# Patient Record
Sex: Female | Born: 1990 | Race: Black or African American | Hispanic: No | Marital: Single | Smoking: Never smoker
Health system: Southern US, Community
[De-identification: ages and names within clinical notes are randomized; demographics above are authoritative.]

## PROBLEM LIST (undated history)

## (undated) ENCOUNTER — Inpatient Hospital Stay (HOSPITAL_COMMUNITY): Payer: Self-pay

## (undated) DIAGNOSIS — E559 Vitamin D deficiency, unspecified: Secondary | ICD-10-CM

## (undated) DIAGNOSIS — E7800 Pure hypercholesterolemia, unspecified: Secondary | ICD-10-CM

## (undated) HISTORY — PX: WISDOM TOOTH EXTRACTION: SHX21

## (undated) HISTORY — PX: KNEE SURGERY: SHX244

---
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
## 2013-04-30 ENCOUNTER — Inpatient Hospital Stay (HOSPITAL_COMMUNITY)
Admission: AD | Admit: 2013-04-30 | Discharge: 2013-04-30 | Disposition: A | Discharge status: Home / Self Care | Payer: Medicaid Other | Source: Ambulatory Visit | Attending: Obstetrics & Gynecology | Admitting: Obstetrics & Gynecology

## 2013-04-30 ENCOUNTER — Inpatient Hospital Stay (HOSPITAL_COMMUNITY): Payer: Medicaid Other

## 2013-04-30 ENCOUNTER — Encounter (HOSPITAL_COMMUNITY): Payer: Self-pay | Admitting: *Deleted

## 2013-04-30 DIAGNOSIS — O210 Mild hyperemesis gravidarum: Secondary | ICD-10-CM | POA: Insufficient documentation

## 2013-04-30 DIAGNOSIS — O219 Vomiting of pregnancy, unspecified: Secondary | ICD-10-CM

## 2013-04-30 DIAGNOSIS — O208 Other hemorrhage in early pregnancy: Secondary | ICD-10-CM | POA: Insufficient documentation

## 2013-04-30 DIAGNOSIS — R109 Unspecified abdominal pain: Secondary | ICD-10-CM | POA: Insufficient documentation

## 2013-04-30 DIAGNOSIS — O26899 Other specified pregnancy related conditions, unspecified trimester: Secondary | ICD-10-CM

## 2013-04-30 HISTORY — DX: Pure hypercholesterolemia, unspecified: E78.00

## 2013-04-30 HISTORY — DX: Vitamin D deficiency, unspecified: E55.9

## 2013-04-30 LAB — URINALYSIS, ROUTINE W REFLEX MICROSCOPIC
Bilirubin Urine: NEGATIVE
Glucose, UA: NEGATIVE mg/dL
Hgb urine dipstick: NEGATIVE
KETONES UR: 40 mg/dL — AB
NITRITE: NEGATIVE
PH: 7.5 (ref 5.0–8.0)
PROTEIN: 30 mg/dL — AB
Specific Gravity, Urine: 1.015 (ref 1.005–1.030)
Urobilinogen, UA: 1 mg/dL (ref 0.0–1.0)

## 2013-04-30 LAB — WET PREP, GENITAL
TRICH WET PREP: NONE SEEN
YEAST WET PREP: NONE SEEN

## 2013-04-30 LAB — POCT PREGNANCY, URINE: PREG TEST UR: POSITIVE — AB

## 2013-04-30 LAB — HCG, QUANTITATIVE, PREGNANCY: hCG, Beta Chain, Quant, S: 59199 m[IU]/mL — ABNORMAL HIGH (ref <5)

## 2013-04-30 LAB — URINE MICROSCOPIC-ADD ON

## 2013-04-30 MED ORDER — ONDANSETRON 4 MG PO TBDP: 4.0000 mg | ORAL_TABLET | Freq: Three times a day (TID) | ORAL | Status: DC | PRN

## 2013-04-30 MED ORDER — DEXTROSE 5 % IN LACTATED RINGERS IV BOLUS
1000.0000 mL | Freq: Once | INTRAVENOUS | Status: AC
  Administered 2013-04-30: 1000 mL via INTRAVENOUS

## 2013-04-30 MED ORDER — PROMETHAZINE HCL 25 MG PO TABS: 25.0000 mg | ORAL_TABLET | Freq: Four times a day (QID) | ORAL | Status: DC | PRN

## 2013-04-30 MED ORDER — ONDANSETRON 8 MG/NS 50 ML IVPB
8.0000 mg | Freq: Once | INTRAVENOUS | Status: AC
  Administered 2013-04-30: 8 mg via INTRAVENOUS
  Filled 2013-04-30: qty 8

## 2013-04-30 NOTE — Discharge Instructions (Signed)
Abdominal Pain During Pregnancy °Abdominal pain is common in pregnancy. Most of the time, it does not cause harm. There are many causes of abdominal pain. Some causes are more serious than others. Some of the causes of abdominal pain in pregnancy are easily diagnosed. Occasionally, the diagnosis takes time to understand. Other times, the cause is not determined. Abdominal pain can be a sign that something is very wrong with the pregnancy, or the pain may have nothing to do with the pregnancy at all. For this reason, always tell your health care provider if you have any abdominal discomfort. °HOME CARE INSTRUCTIONS  °Monitor your abdominal pain for any changes. The following actions may help to alleviate any discomfort you are experiencing: °· Do not have sexual intercourse or put anything in your vagina until your symptoms go away completely. °· Get plenty of rest until your pain improves. °· Drink clear fluids if you feel nauseous. Avoid solid food as long as you are uncomfortable or nauseous. °· Only take over-the-counter or prescription medicine as directed by your health care provider. °· Keep all follow-up appointments with your health care provider. °SEEK IMMEDIATE MEDICAL CARE IF: °· You are bleeding, leaking fluid, or passing tissue from the vagina. °· You have increasing pain or cramping. °· You have persistent vomiting. °· You have painful or bloody urination. °· You have a fever. °· You notice a decrease in your baby's movements. °· You have extreme weakness or feel faint. °· You have shortness of breath, with or without abdominal pain. °· You develop a severe headache with abdominal pain. °· You have abnormal vaginal discharge with abdominal pain. °· You have persistent diarrhea. °· You have abdominal pain that continues even after rest, or gets worse. °MAKE SURE YOU:  °· Understand these instructions. °· Will watch your condition. °· Will get help right away if you are not doing well or get  worse. °Document Released: 03/15/2005 Document Revised: 01/03/2013 Document Reviewed: 10/12/2012 °ExitCare® Patient Information ©2014 [employer]. ° °

## 2013-04-30 NOTE — MAU Provider Note (Signed)
Attestation of Attending Supervision of Advanced Practitioner (CNM/NP): Evaluation and management procedures were performed by the Advanced Practitioner under my supervision and collaboration.  I have reviewed the Advanced Practitioner's note and chart, and I agree with the management and plan.  [name], [name] 5:02 PM     

## 2013-04-30 NOTE — MAU Provider Note (Signed)
History     CSN: [id number]  Arrival date and time: 04/30/13 1228   First Provider Initiated Contact with Patient 04/30/13 1441      Chief Complaint  Patient presents with  . Hyperemesis Gravidarum   HPI  Ms. Tiffany Ball is a 23 y.o. female G1P0 at 5174w6d who presents with vomiting in pregnancy. The vomiting started 2 weeks ago; she vomits everyday. She has not taken anything for the vomiting and has not seen a Dr. for this pregnancy. The patient is accompanied by her mother who states the patient plans to start care with [facility]. The patient also complains of lower-mid abdominal pain that occurs every time she vomits. She is vomiting three times per day and states she is unable to keep any food or liquids.  Pt currently rates her abdominal pain 8/10  OB History   Grav Para Term Preterm Abortions TAB SAB Ect Mult Living   1         0      Past Medical History  Diagnosis Date  . Hypercholesteremia   . Vitamin D deficiency     Past Surgical History  Procedure Laterality Date  . Knee cyst  2008  . Knee surgery      right  . Wisdom tooth extraction      History reviewed. No pertinent family history.  History  Substance Use Topics  . Smoking status: Never Smoker   . Smokeless tobacco: Not on file  . Alcohol Use: No    Allergies: No Known Allergies  Prescriptions prior to admission  Medication Sig Dispense Refill  . Prenatal Vit-Fe Fumarate-FA (PRENATAL MULTIVITAMIN) TABS tablet Take 1 tablet by mouth daily at 12 noon.       Results for orders placed during the hospital encounter of 04/30/13 (from the past 48 hour(s))  URINALYSIS, ROUTINE W REFLEX MICROSCOPIC     Status: Abnormal   Collection Time    04/30/13  1:18 PM      Result Value Range   Color, Urine YELLOW  YELLOW   APPearance CLEAR  CLEAR   Specific Gravity, Urine 1.015  1.005 - 1.030   pH 7.5  5.0 - 8.0   Glucose, UA NEGATIVE  NEGATIVE mg/dL   Hgb urine dipstick NEGATIVE  NEGATIVE   Bilirubin Urine  NEGATIVE  NEGATIVE   Ketones, ur 40 (*) NEGATIVE mg/dL   Protein, ur 30 (*) NEGATIVE mg/dL   Urobilinogen, UA 1.0  0.0 - 1.0 mg/dL   Nitrite NEGATIVE  NEGATIVE   Leukocytes, UA TRACE (*) NEGATIVE  URINE MICROSCOPIC-ADD ON     Status: Abnormal   Collection Time    04/30/13  1:18 PM      Result Value Range   Squamous Epithelial / LPF FEW (*) RARE   WBC, UA 0-2  <3 WBC/hpf   Bacteria, UA FEW (*) RARE   Urine-Other MUCOUS PRESENT    POCT PREGNANCY, URINE     Status: Abnormal   Collection Time    04/30/13  1:22 PM      Result Value Range   Preg Test, Ur POSITIVE (*) NEGATIVE   Comment:            THE SENSITIVITY OF THIS     METHODOLOGY IS >24 mIU/mL  HCG, QUANTITATIVE, PREGNANCY     Status: Abnormal   Collection Time    04/30/13  3:06 PM      Result Value Range   hCG, Beta Chain, Quant, S 59199 (*) <  5 mIU/mL   Comment:              GEST. AGE      CONC.  (mIU/mL)       <=1 WEEK        5 - 50         2 WEEKS       50 - 500         3 WEEKS       100 - 10,000         4 WEEKS     1,000 - 30,000         5 WEEKS     3,500 - 115,000       6-8 WEEKS     12,000 - 270,000        12 WEEKS     15,000 - 220,000                FEMALE AND NON-PREGNANT FEMALE:         LESS THAN 5 mIU/mL  WET PREP, GENITAL     Status: Abnormal   Collection Time    04/30/13  4:04 PM      Result Value Range   Yeast Wet Prep HPF POC NONE SEEN  NONE SEEN   Trich, Wet Prep NONE SEEN  NONE SEEN   Clue Cells Wet Prep HPF POC MODERATE (*) NONE SEEN   WBC, Wet Prep HPF POC MANY (*) NONE SEEN   Comment: MANY BACTERIA SEEN    US Ob Comp Less 14 Wks  04/30/2013   CLINICAL DATA:  22 year old female with abdominal pain. Early pregnancy. Nausea vomiting. Quantitative beta HCG pending. Estimated gestational age by LMP 9 weeks and 6 days. Initial encounter.  EXAM: OBSTETRIC <14 WK ULTRASOUND  TECHNIQUE: Transabdominal ultrasound was performed for evaluation of the gestation as well as the maternal uterus and adnexal regions.   COMPARISON:  None.  FINDINGS: Intrauterine gestational sac: Single  Yolk sac:  Visible  Embryo:  Visible  Cardiac Activity: Detected  Heart Rate: 140 bpm  CRL:   9.5  mm   7 w 0 d                  [place] EDC: 12/17/2013  Maternal uterus/adnexae: Small volume subchorionic hemorrhage (image 11). No pelvic free fluid. Normal left ovary which appears to contain corpus luteum (image 32). Right ovary not identified.  IMPRESSION: Single living IUP demonstrated. Small volume subchorionic hemorrhage, otherwise no acute maternal findings visualized.   Electronically Signed   By: Augusto Gamble M.D.   On: 04/30/2013 16:01    Review of Systems  Constitutional: Negative for fever and chills.  Gastrointestinal: Positive for nausea, vomiting and abdominal pain. Negative for heartburn, diarrhea and constipation.  Genitourinary: Negative for dysuria, urgency, frequency and hematuria.       No vaginal discharge. No vaginal bleeding. No dysuria.    Physical Exam   Blood pressure 100/58, pulse 72, temperature 98.7 F (37.1 C), temperature source Oral, resp. rate 18, height 5\' 2"  (1.575 m), weight 44.623 kg (98 lb 6 oz), last menstrual period 02/20/2013.  Physical Exam  Constitutional: She is oriented to person, place, and time. She appears well-developed and well-nourished.  Non-toxic appearance. She does not have a sickly appearance. She appears ill. No distress.  HENT:  Head: Normocephalic.  Eyes: Pupils are equal, round, and reactive to light.  Neck: Neck supple.  GI: There is tenderness in the periumbilical area  and suprapubic area. There is rigidity. There is no rebound and no guarding.  Genitourinary:  Bimanual exam: Cervix closed Uterus non tender, normal size; gravid  Adnexa non tender, no masses bilaterally GC/Chlam, wet prep done Chaperone present for exam.   Neurological: She is alert and oriented to person, place, and time.  Skin: Skin is warm. She is not diaphoretic.    MAU Course   Procedures None  MDM Wet prep  GC/Chlamydia- no speculum exam done; blind swabs only. Pt has never had an [place].  D5 LR times one liter bolus  Zofran 8 mg IVP Pt tolerate po fluids and crackers in MAU prior to discharge home  Abdominal pain is gone; pt rates her pain 0/10  Assessment and Plan    A: IUP on US done 04/30/2013 Subchorionic hemorrhage  Nausea and vomiting in pregnancy Abdominal pain in pregnancy    P:  Discharge home Wet prep pending; I will call the patient if abnormal RX: Zofran         Phenergan  Start prenatal care ASAP Return to MAU as needed, if symptoms worsen    Iona Hansen Dewey Neukam, NP  04/30/2013, 2:42 PM

## 2013-04-30 NOTE — MAU Note (Signed)
Pt states here for n/v, spitting a lot. Vomited x3 today. Having lower abdominal pain. Denies abnormal vaginal discharge or bleeding.

## 2013-05-01 ENCOUNTER — Telehealth: Payer: Self-pay | Admitting: *Deleted

## 2013-05-01 LAB — GC/CHLAMYDIA PROBE AMP
CT PROBE, AMP APTIMA: POSITIVE — AB
GC PROBE AMP APTIMA: NEGATIVE

## 2013-05-01 MED ORDER — AZITHROMYCIN 250 MG PO TABS: ORAL_TABLET | ORAL | Status: DC

## 2013-05-01 NOTE — Telephone Encounter (Addendum)
Message copied by Jill SideAY, DIANE L on Tue May 01, 2013 10:07 AM ------      Message from: Pennie BanterSMITH, MARNI W      Created: Tue May 01, 2013  8:49 AM       Telephone call to patient regarding positive chlamydia culture, patient notified.  Patient has not been treated and will need Rx called in per protocol to [facility] on [address].  Instructed patient to notify her partner for treatment and to abstain from sex until seven days past date both are treated.  Report faxed to health department.   ------  Rx for Azithromycin sent to pharmacy per protocol.

## 2013-10-04 ENCOUNTER — Emergency Department (HOSPITAL_COMMUNITY)
Admission: EM | Admit: 2013-10-04 | Discharge: 2013-10-04 | Disposition: A | Discharge status: Home / Self Care | Payer: Medicaid Other | Attending: Emergency Medicine | Admitting: Emergency Medicine

## 2013-10-04 ENCOUNTER — Encounter (HOSPITAL_COMMUNITY): Payer: Self-pay | Admitting: Emergency Medicine

## 2013-10-04 ENCOUNTER — Emergency Department (HOSPITAL_COMMUNITY): Payer: Medicaid Other

## 2013-10-04 DIAGNOSIS — Y9389 Activity, other specified: Secondary | ICD-10-CM | POA: Insufficient documentation

## 2013-10-04 DIAGNOSIS — IMO0002 Reserved for concepts with insufficient information to code with codable children: Secondary | ICD-10-CM | POA: Insufficient documentation

## 2013-10-04 DIAGNOSIS — Z79899 Other long term (current) drug therapy: Secondary | ICD-10-CM | POA: Diagnosis not present

## 2013-10-04 DIAGNOSIS — Z862 Personal history of diseases of the blood and blood-forming organs and certain disorders involving the immune mechanism: Secondary | ICD-10-CM | POA: Insufficient documentation

## 2013-10-04 DIAGNOSIS — Z8639 Personal history of other endocrine, nutritional and metabolic disease: Secondary | ICD-10-CM | POA: Insufficient documentation

## 2013-10-04 DIAGNOSIS — Y92410 Unspecified street and highway as the place of occurrence of the external cause: Secondary | ICD-10-CM | POA: Diagnosis not present

## 2013-10-04 DIAGNOSIS — S0990XA Unspecified injury of head, initial encounter: Secondary | ICD-10-CM | POA: Insufficient documentation

## 2013-10-04 MED ORDER — TRAMADOL HCL 50 MG PO TABS
50.0000 mg | ORAL_TABLET | Freq: Once | ORAL | Status: AC
  Administered 2013-10-04: 50 mg via ORAL
  Filled 2013-10-04: qty 1

## 2013-10-04 MED ORDER — TRAMADOL HCL 50 MG PO TABS: 50.0000 mg | ORAL_TABLET | Freq: Four times a day (QID) | ORAL | Status: DC | PRN

## 2013-10-04 NOTE — ED Notes (Signed)
Patient was discharged with all personal items. 
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
## 2013-10-04 NOTE — ED Notes (Signed)
Pt now reports chest pain 5/10 and sharp. Pt reports having intermittent chest pain for "a few months".

## 2013-10-04 NOTE — Discharge Instructions (Signed)
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
## 2013-10-09 NOTE — ED Provider Notes (Signed)
Medical screening examination/treatment/procedure(s) were performed by non-physician practitioner and as supervising physician I was immediately available for consultation/collaboration.   EKG Interpretation   Date/Time:  Thursday October 04 2013 20:20:57 EDT Ventricular Rate:  99 PR Interval:  128 QRS Duration: 74 QT Interval:  336 QTC Calculation: 431 R Axis:   78 Text Interpretation:  Normal sinus rhythm Normal ECG No old tracing to  compare Confirmed by Hodan Wurtz  MD-I, Halynn Reitano ([id number]) on 10/04/2013 8:33:22 PM      Devoria AlbeIva Emmry Hinsch, MD, Franz DellFACEP   Omair Dettmer L Eylin Pontarelli, MD 10/09/13 [phone]

## 2014-01-28 ENCOUNTER — Encounter (HOSPITAL_COMMUNITY): Payer: Self-pay | Admitting: Emergency Medicine

## 2014-10-18 ENCOUNTER — Inpatient Hospital Stay (HOSPITAL_COMMUNITY)
Admission: AD | Admit: 2014-10-18 | Discharge: 2014-10-19 | Discharge status: Against Medical Advice | Payer: Medicaid Other | Source: Ambulatory Visit | Attending: Obstetrics and Gynecology | Admitting: Obstetrics and Gynecology

## 2014-10-18 DIAGNOSIS — Z5320 Procedure and treatment not carried out because of patient's decision for unspecified reasons: Secondary | ICD-10-CM | POA: Insufficient documentation

## 2014-10-18 LAB — URINE MICROSCOPIC-ADD ON

## 2014-10-18 LAB — URINALYSIS, ROUTINE W REFLEX MICROSCOPIC
Glucose, UA: NEGATIVE mg/dL
Hgb urine dipstick: NEGATIVE
Ketones, ur: 15 mg/dL — AB
Nitrite: NEGATIVE
Protein, ur: NEGATIVE mg/dL
Specific Gravity, Urine: 1.025 (ref 1.005–1.030)
Urobilinogen, UA: 4 mg/dL — ABNORMAL HIGH (ref 0.0–1.0)
pH: 6 (ref 5.0–8.0)

## 2014-10-18 LAB — POCT PREGNANCY, URINE: Preg Test, Ur: NEGATIVE

## 2014-10-18 NOTE — MAU Note (Signed)
My cousin came to be checked for STD so I told her i would come to to make her feel better. Denies any d/c. Started period on 7/20 and having some cramping from that. Wants to be checked for STDs

## 2014-10-19 NOTE — MAU Note (Signed)
Pt's family member being discharged so patient decided to leave AMA. Paper signed.

## 2015-03-11 ENCOUNTER — Encounter (HOSPITAL_COMMUNITY): Payer: Self-pay | Admitting: Emergency Medicine

## 2015-03-11 ENCOUNTER — Emergency Department (INDEPENDENT_AMBULATORY_CARE_PROVIDER_SITE_OTHER)
Admission: EM | Admit: 2015-03-11 | Discharge: 2015-03-11 | Disposition: A | Discharge status: Home / Self Care | Payer: Self-pay | Source: Home / Self Care | Attending: Emergency Medicine | Admitting: Emergency Medicine

## 2015-03-11 DIAGNOSIS — A059 Bacterial foodborne intoxication, unspecified: Secondary | ICD-10-CM

## 2015-03-11 LAB — POCT URINALYSIS DIP (DEVICE)
Bilirubin Urine: NEGATIVE
Glucose, UA: NEGATIVE mg/dL
HGB URINE DIPSTICK: NEGATIVE
Ketones, ur: NEGATIVE mg/dL
Leukocytes, UA: NEGATIVE
NITRITE: NEGATIVE
PH: 5.5 (ref 5.0–8.0)
PROTEIN: 30 mg/dL — AB
UROBILINOGEN UA: 0.2 mg/dL (ref 0.0–1.0)

## 2015-03-11 LAB — POCT PREGNANCY, URINE: Preg Test, Ur: NEGATIVE

## 2015-03-11 MED ORDER — ONDANSETRON HCL 4 MG PO TABS: 4.0000 mg | ORAL_TABLET | Freq: Three times a day (TID) | ORAL | Status: DC | PRN

## 2015-03-11 MED ORDER — ONDANSETRON HCL 4 MG/2ML IJ SOLN
INTRAMUSCULAR | Status: AC
  Filled 2015-03-11: qty 2

## 2015-03-11 MED ORDER — ONDANSETRON HCL 4 MG/2ML IJ SOLN
4.0000 mg | Freq: Once | INTRAMUSCULAR | Status: AC
  Administered 2015-03-11: 4 mg via INTRAVENOUS

## 2015-03-11 MED ORDER — ONDANSETRON HCL 4 MG/2ML IJ SOLN: 4.0000 mg | Freq: Once | INTRAMUSCULAR | Status: DC

## 2015-03-11 MED ORDER — SODIUM CHLORIDE 0.9 % IV BOLUS (SEPSIS)
1000.0000 mL | Freq: Once | INTRAVENOUS | Status: AC
  Administered 2015-03-11: 1000 mL via INTRAVENOUS

## 2015-03-11 NOTE — ED Notes (Signed)
Patient was able to tolerate oral fluid challenge with no nausea and was able to ambulate without assistance.

## 2015-03-11 NOTE — Discharge Instructions (Signed)
You likely have food poisoning. Take Zofran every 8 hours as needed for nausea or vomiting. Make sure you are drinking lots of fluids. Your symptoms should improve over the next 24-48 hours. Follow-up as needed.

## 2015-03-11 NOTE — ED Notes (Addendum)
[facility] with a complaint of N/V/D and abdominal cramping that started around 12 noon today. The patient had active emesis at the time of assessment.

## 2015-03-11 NOTE — ED Provider Notes (Signed)
CSN: [id number]     Arrival date & time 03/11/15  1818 History   First MD Initiated Contact with Patient 03/11/15 1848     Chief Complaint  Patient presents with  . Emesis  . Abdominal Cramping   (Consider location/radiation/quality/duration/timing/severity/associated sxs/prior Treatment) HPI  She is a 24 year old woman here for evaluation of vomiting. She states she started having vomiting, diffuse abdominal pain, and diarrhea around noon today. The vomit is yellow. She denies any blood in the vomit or diarrhea. She has been unable to keep anything down, including fluids. She does report feeling dizzy, particularly with standing. She had cookout for dinner last night. No known sick contacts.  No fevers or chills.  History reviewed. No pertinent past medical history. No past surgical history on file. History reviewed. No pertinent family history. Social History  Substance Use Topics  . Smoking status: None  . Smokeless tobacco: None  . Alcohol Use: None   OB History    No data available     Review of Systems As in history of present illness Allergies  Review of patient's allergies indicates no known allergies.  Home Medications   Prior to Admission medications   Medication Sig Start Date End Date Taking? Authorizing Provider  ondansetron (ZOFRAN) 4 MG tablet Take 1 tablet (4 mg total) by mouth every 8 (eight) hours as needed for nausea or vomiting. 03/11/15   Charm RingsErin J Honig, MD   Meds Ordered and Administered this Visit   Medications  sodium chloride 0.9 % bolus 1,000 mL (1,000 mLs Intravenous Given 03/11/15 1952)  ondansetron (ZOFRAN) injection 4 mg (4 mg Intravenous Given 03/11/15 1955)    BP 128/76 mmHg  Pulse 106  Temp(Src) 97.9 F (36.6 C) (Oral)  Resp 18  SpO2 100%  LMP 02/21/2015 (Exact Date) Orthostatic VS for the past 24 hrs:  BP- Lying Pulse- Lying BP- Sitting Pulse- Sitting BP- Standing at 0 minutes Pulse- Standing at 0 minutes  03/11/15 1857 134/83 mmHg  107 119/87 mmHg 124 133/77 mmHg 135    Physical Exam  Constitutional: She is oriented to person, place, and time. She appears well-developed and well-nourished. No distress.  HENT:  Dry mucous membranes  Neck: Neck supple.  Cardiovascular: Normal rate, regular rhythm and normal heart sounds.   No murmur heard. Normal rate when lying flat on exam table  Pulmonary/Chest: Effort normal and breath sounds normal. No respiratory distress. She has no wheezes. She has no rales.  Abdominal: Soft. Bowel sounds are normal. She exhibits no distension and no mass. There is tenderness (diffuse). There is guarding (Voluntary). There is no rebound.  Neurological: She is alert and oriented to person, place, and time.    ED Course  Procedures (including critical care time)  Labs Review Labs Reviewed  POCT URINALYSIS DIP (DEVICE) - Abnormal; Notable for the following:    Protein, ur 30 (*)    All other components within normal limits  POCT PREGNANCY, URINE    Imaging Review No results found.    MDM   1. Food poisoning    She is tolerating fluids well after a 1 L normal saline bolus and 4 mg of IV Zofran. She is stable for discharge home. Prescription given for Zofran to use as needed. Expect improvement over the next 1-2 days.    Charm RingsErin J Honig, MD 03/11/15 2017

## 2015-03-11 NOTE — ED Notes (Signed)
Patient's vitals reassessed and oral fluid challenge initiated.

## 2015-07-25 ENCOUNTER — Encounter (HOSPITAL_COMMUNITY): Payer: Self-pay | Admitting: Emergency Medicine

## 2015-07-25 ENCOUNTER — Emergency Department (HOSPITAL_COMMUNITY)
Admission: EM | Admit: 2015-07-25 | Discharge: 2015-07-25 | Disposition: A | Discharge status: Home / Self Care | Payer: Self-pay | Attending: Emergency Medicine | Admitting: Emergency Medicine

## 2015-07-25 DIAGNOSIS — K047 Periapical abscess without sinus: Secondary | ICD-10-CM

## 2015-07-25 DIAGNOSIS — K029 Dental caries, unspecified: Secondary | ICD-10-CM

## 2015-07-25 DIAGNOSIS — K002 Abnormalities of size and form of teeth: Secondary | ICD-10-CM | POA: Insufficient documentation

## 2015-07-25 DIAGNOSIS — N760 Acute vaginitis: Secondary | ICD-10-CM

## 2015-07-25 MED ORDER — PENICILLIN V POTASSIUM 500 MG PO TABS: 1000.0000 mg | ORAL_TABLET | Freq: Two times a day (BID) | ORAL | Status: DC

## 2015-07-25 MED ORDER — FLUCONAZOLE 150 MG PO TABS: 150.0000 mg | ORAL_TABLET | Freq: Once | ORAL | Status: DC

## 2015-07-25 NOTE — ED Provider Notes (Signed)
CSN: [id number]     Arrival date & time 07/25/15  2133 History  By signing my name below, I, Tanda RockersMargaux Venter, attest that this documentation has been prepared under the direction and in the presence of [id number]Lyndel Dancel, [employer]PA-C. Electronically Signed: Tanda RockersMargaux Venter, ED Scribe. 07/25/2015. 9:56 PM.   Chief Complaint  Patient presents with  . Dental Pain   Patient is a 25 y.o. female presenting with tooth pain. The history is provided by the patient. No language interpreter was used.  Dental Pain Location:  Lower Lower teeth location:  32/RL 3rd molar Quality:  Throbbing Severity:  Severe Onset quality:  Gradual Duration:  2 weeks Timing:  Constant Progression:  Waxing and waning Chronicity:  New Context: dental fracture   Relieved by:  Nothing Worsened by:  Touching, cold food/drink and pressure (chewing) Ineffective treatments:  Acetaminophen and topical anesthetic gel Associated symptoms: facial swelling (gums) and gum swelling   Associated symptoms: no difficulty swallowing, no drooling, no fever, no oral bleeding and no trismus   Risk factors: lack of dental care   Risk factors: no diabetes, no immunosuppression and no smoking      HPI Comments: Timmothy EulerQuandrika L Puglia is a 25 y.o. female who presents to the Emergency Department complaining of gradual onset, constant, 10/10, throbbing, right lower dental pain radiating to right ear x 2 weeks, worsening in the past week. Pt reports that her tooth is cracked, causing the pain. The pain is exacerbated with cold air, palpation, and chewing. Pt also complains of mild gum swelling. She has been applying Orajel and taking Tylenol PM without relief. Pt is able to open and close her mouth and is tolerating her own secretions. Denies drainage from the gums, bleeding from the gums, drooling, difficulty swallowing, trismus, fever, chills, ear drainage, rhinorrhea, chest pain, shortness of breath, abdominal pain, nausea, vomiting, diarrhea, constipation,  dysuria, hematuria, weakness, numbness, tingling, joint pain, joint swelling, or any other associated symptoms. Pt is non smoker. No hx DM/HIV or other immunosuppressant conditions/medications. No dentist.  At the end of the encounter, pt also reports having a thick white/creamy itchy vaginal discharge for the past 2 days and is concerned she could have a yeast infection. She denies possibility of pregnancy at this time. LMP 06/29/15.  No past medical history on file. No past surgical history on file. No family history on file. Social History  Substance Use Topics  . Smoking status: Not on file  . Smokeless tobacco: Not on file  . Alcohol Use: Not on file   OB History    No data available     Review of Systems  Constitutional: Negative for fever and chills.  HENT: Positive for dental problem (right lower), ear pain (right) and facial swelling (gums). Negative for drooling, ear discharge, rhinorrhea, sore throat and trouble swallowing.        + Gum swelling  Negative for drainage or bleeding from gums  Respiratory: Negative for shortness of breath.   Cardiovascular: Negative for chest pain.  Gastrointestinal: Negative for nausea, vomiting, abdominal pain, diarrhea and constipation.  Genitourinary: Positive for vaginal discharge (thick white creamy, with vaginal itching). Negative for dysuria and hematuria.  Musculoskeletal: Negative for myalgias, joint swelling and arthralgias.  Allergic/Immunologic: Negative for immunocompromised state.  Neurological: Negative for weakness and numbness.  Psychiatric/Behavioral: Negative for confusion.   A complete 10 system review of systems was obtained and all systems are negative except as noted in the HPI and PMH.   Allergies  Review  of patient's allergies indicates no known allergies.  Home Medications   Prior to Admission medications   Medication Sig Start Date End Date Taking? Authorizing Provider  ondansetron (ZOFRAN) 4 MG tablet Take 1  tablet (4 mg total) by mouth every 8 (eight) hours as needed for nausea or vomiting. 03/11/15   Charm Rings, MD   BP 129/71 mmHg  Pulse 103  Temp(Src) 99.3 F (37.4 C) (Oral)  Resp 18  SpO2 100%  LMP 06/29/2015   Physical Exam  Constitutional: She is oriented to person, place, and time. Vital signs are normal. She appears well-developed and well-nourished.  Non-toxic appearance. No distress.  Afebrile, nontoxic, NAD  HENT:  Head: Normocephalic and atraumatic.  Mouth/Throat: Uvula is midline, oropharynx is clear and moist and mucous membranes are normal. No trismus in the jaw. Abnormal dentition. Dental abscesses and dental caries present. No uvula swelling.    Right lower molar #32 with small crack in enamel, positive caries and slight swelling and erythema to the gingiva surrounding the tooth suspicious for early abscess, no fluctuance or drainage, no evidence of Ludwig's angina. Nose clear. Oropharynx clear and moist, without uvular swelling or deviation, no trismus or drooling, no tonsillar swelling or erythema, no exudates.   Eyes: Conjunctivae and EOM are normal. Right eye exhibits no discharge. Left eye exhibits no discharge.  Neck: Normal range of motion. Neck supple.  Cardiovascular: Normal rate.   Pulmonary/Chest: Effort normal. No respiratory distress.  Abdominal: Normal appearance. She exhibits no distension.  Musculoskeletal: Normal range of motion.  Lymphadenopathy:       Head (right side): Submandibular adenopathy present.  Right submandibular reactive LAD  Neurological: She is alert and oriented to person, place, and time. She has normal strength. No sensory deficit.  Skin: Skin is warm, dry and intact. No rash noted.  Psychiatric: She has a normal mood and affect. Her behavior is normal.  Nursing note and vitals reviewed.   ED Course  Procedures (including critical care time)  DIAGNOSTIC STUDIES: Oxygen Saturation is 99% on RA, normal by my interpretation.     COORDINATION OF CARE: 9:51 PM-Discussed treatment plan which includes Rx antibiotics with pt at bedside and pt agreed to plan.   Labs Review Labs Reviewed - No data to display  Imaging Review No results found.   EKG Interpretation None      MDM   Final diagnoses:  Pain due to dental caries  Dental abscess  Vaginitis    25 y.o. female here with Dental pain associated with dental caries and possible dental abscess with patient afebrile, non toxic appearing and swallowing secretions well. I gave patient referral to dentist and stressed the importance of dental follow up for ultimate management of dental pain.  I have also discussed reasons to return immediately to the ER.  Patient expresses understanding and agrees with plan.  I will also give PCN VK. Discussed tylenol/motrin for pain. At the end of the encounter pt also mentions that she thinks she has a yeast infection, will rx diflucan one tab tonight and one tab after the course of abx. Do not feel we need to perform pelvic exam today since she has no abd pain complaints. She can f/up with [facility] in 1-2wks to establish care and f/up with this non-urgent condition.   I personally performed the services described in this documentation, which was scribed in my presence. The recorded information has been reviewed and is accurate.  BP 129/71 mmHg  Pulse 103  Temp(Src) 99.3 F (37.4 C) (Oral)  Resp 18  SpO2 100%  LMP 06/29/2015  Meds ordered this encounter  Medications  . penicillin v potassium (VEETID) 500 MG tablet    Sig: Take 2 tablets (1,000 mg total) by mouth 2 (two) times daily. X 7 days    Dispense:  28 tablet    Refill:  0    Order Specific Question:  Supervising Provider    Answer:  MILLER, BRIAN [[id number]]  . fluconazole (DIFLUCAN) 150 MG tablet    Sig: Take 1 tablet (150 mg total) by mouth once. Take one tablet tonight (07/25/15) and one tablet after completing the antibiotics in 1 week    Dispense:  2 tablet     Refill:  0    Order Specific Question:  Supervising Provider    Answer:  Eber Hong [[id number]]        Chane Magner Camprubi-Soms, PA-C 07/25/15 2203  Blane Ohara, MD 07/26/15 [phone]

## 2015-07-25 NOTE — Discharge Instructions (Signed)
Apply warm compresses to jaw throughout the day. Take antibiotic until finished. Use tylenol or motrin as needed for pain. Followup with a dentist is very important for ongoing evaluation and management of recurrent dental pain, use the list below or the doctor listed above to find a dentist. Use diflucan for your vaginal yeast infection symptoms, take one tablet today and then one tablet after completing the antibiotics in 1 week. Follow up with [facility] in 1-2 weeks for recheck of symptoms and to establish care. Return to emergency department for emergent changing or worsening symptoms.   Dental Abscess A dental abscess is a collection of pus in or around a tooth. CAUSES This condition is caused by a bacterial infection around the root of the tooth that involves the inner part of the tooth (pulp). It may result from:  Severe tooth decay.  Trauma to the tooth that allows bacteria to enter into the pulp, such as a broken or chipped tooth.  Severe gum disease around a tooth. SYMPTOMS Symptoms of this condition include:  Severe pain in and around the infected tooth.  Swelling and redness around the infected tooth, in the mouth, or in the face.  Tenderness.  Pus drainage.  Bad breath.  Bitter taste in the mouth.  Difficulty swallowing.  Difficulty opening the mouth.  Nausea.  Vomiting.  Chills.  Swollen neck glands.  Fever. DIAGNOSIS This condition is diagnosed with examination of the infected tooth. During the exam, your dentist may tap on the infected tooth. Your dentist will also ask about your medical and dental history and may order X-rays. TREATMENT This condition is treated by eliminating the infection. This may be done with:  Antibiotic medicine.  A root canal. This may be performed to save the tooth.  Pulling (extracting) the tooth. This may also involve draining the abscess. This is done if the tooth cannot be saved. HOME CARE  INSTRUCTIONS  Take medicines only as directed by your dentist.  If you were prescribed antibiotic medicine, finish all of it even if you start to feel better.  Rinse your mouth (gargle) often with salt water to relieve pain or swelling.  Do not drive or operate heavy machinery while taking pain medicine.  Do not apply heat to the outside of your mouth.  Keep all follow-up visits as directed by your dentist. This is important. SEEK MEDICAL CARE IF:  Your pain is worse and is not helped by medicine. SEEK IMMEDIATE MEDICAL CARE IF:  You have a fever or chills.  Your symptoms suddenly get worse.  You have a very bad headache.  You have problems breathing or swallowing.  You have trouble opening your mouth.  You have swelling in your neck or around your eye.   This information is not intended to replace advice given to you by your health care provider. Make sure you discuss any questions you have with your health care provider.   Document Released: 03/15/2005 Document Revised: 07/30/2014 Document Reviewed: 03/12/2014 Elsevier Interactive Patient Education 2016 Elsevier Inc.  Dental Caries Dental caries (also called tooth decay) is the most common oral disease. It can occur at any age but is more common in children and young adults.  HOW DENTAL CARIES DEVELOPS  The process of decay begins when bacteria and foods (particularly sugars and starches) combine in your mouth to produce plaque. Plaque is a substance that sticks to the hard, outer surface of a tooth (enamel). The bacteria in plaque produce acids that  attack enamel. These acids may also attack the root surface of a tooth (cementum) if it is exposed. Repeated attacks dissolve these surfaces and create holes in the tooth (cavities). If left untreated, the acids destroy the other layers of the tooth.  RISK FACTORS  Frequent sipping of sugary beverages.   Frequent snacking on sugary and starchy foods, especially those that  easily get stuck in the teeth.   Poor oral hygiene.   Dry mouth.   Substance abuse such as methamphetamine abuse.   Broken or poor-fitting dental restorations.   Eating disorders.   Gastroesophageal reflux disease (GERD).   Certain radiation treatments to the head and neck. SYMPTOMS In the early stages of dental caries, symptoms are seldom present. Sometimes white, chalky areas may be seen on the enamel or other tooth layers. In later stages, symptoms may include:  Pits and holes on the enamel.  Toothache after sweet, hot, or cold foods or drinks are consumed.  Pain around the tooth.  Swelling around the tooth. DIAGNOSIS  Most of the time, dental caries is detected during a regular dental checkup. A diagnosis is made after a thorough medical and dental history is taken and the surfaces of your teeth are checked for signs of dental caries. Sometimes special instruments, such as lasers, are used to check for dental caries. Dental X-ray exams may be taken so that areas not visible to the eye (such as between the contact areas of the teeth) can be checked for cavities.  TREATMENT  If dental caries is in its early stages, it may be reversed with a fluoride treatment or an application of a remineralizing agent at the dental office. Thorough brushing and flossing at home is needed to aid these treatments. If it is in its later stages, treatment depends on the location and extent of tooth destruction:   If a small area of the tooth has been destroyed, the destroyed area will be removed and cavities will be filled with a material such as gold, silver amalgam, or composite resin.   If a large area of the tooth has been destroyed, the destroyed area will be removed and a cap (crown) will be fitted over the remaining tooth structure.   If the center part of the tooth (pulp) is affected, a procedure called a root canal will be needed before a filling or crown can be placed.   If most  of the tooth has been destroyed, the tooth may need to be pulled (extracted). HOME CARE INSTRUCTIONS You can prevent, stop, or reverse dental caries at home by practicing good oral hygiene. Good oral hygiene includes:  Thoroughly cleaning your teeth at least twice a day with a toothbrush and dental floss.   Using a fluoride toothpaste. A fluoride mouth rinse may also be used if recommended by your dentist or health care provider.   Restricting the amount of sugary and starchy foods and sugary liquids you consume.   Avoiding frequent snacking on these foods and sipping of these liquids.   Keeping regular visits with a dentist for checkups and cleanings. PREVENTION   Practice good oral hygiene.  Consider a dental sealant. A dental sealant is a coating material that is applied by your dentist to the pits and grooves of teeth. The sealant prevents food from being trapped in them. It may protect the teeth for several years.  Ask about fluoride supplements if you live in a community without fluorinated water or with water that has a low fluoride  content. Use fluoride supplements as directed by your dentist or health care provider.  Allow fluoride varnish applications to teeth if directed by your dentist or health care provider.   This information is not intended to replace advice given to you by your health care provider. Make sure you discuss any questions you have with your health care provider.   Document Released: 12/05/2001 Document Revised: 04/05/2014 Document Reviewed: 03/17/2012 Elsevier Interactive Patient Education 2016 [employer]  Vaginitis Vaginitis is an inflammation of the vagina. It can happen when the normal bacteria and yeast in the vagina grow too much. There are different types. Treatment will depend on the type you have. HOME CARE  Take all medicines as told by your doctor.  Keep your vagina area clean and dry. Avoid soap. Rinse the area with water.  Avoid  washing and cleaning out the vagina (douching).  Do not use tampons or have sex (intercourse) until your treatment is done.  Wipe from front to back after going to the restroom.  Wear cotton underwear.  Avoid wearing underwear while you sleep until your vaginitis is gone.  Avoid tight pants. Avoid underwear or nylons without a cotton panel.  Take off wet clothing (such as a bathing suit) as soon as you can.  Use mild, unscented products. Avoid fabric softeners and scented:  Feminine sprays.  Laundry detergents.  Tampons.  Soaps or bubble baths.  Practice safe sex and use condoms. GET HELP RIGHT AWAY IF:   You have belly (abdominal) pain.  You have a fever or lasting symptoms for more than 2-3 days.  You have a fever and your symptoms suddenly get worse. MAKE SURE YOU:   Understand these instructions.  Will watch this condition.  Will get help right away if you are not doing well or get worse.   This information is not intended to replace advice given to you by your health care provider. Make sure you discuss any questions you have with your health care provider.   Document Released: 06/11/2008 Document Revised: 12/08/2011 Document Reviewed: 08/26/2011 Elsevier Interactive Patient Education 2016 [employer]   Emergency Department Resource Guide 1) Find a Doctor and Pay Out of Pocket Although you won't have to find out who is covered by your insurance plan, it is a good idea to ask around and get recommendations. You will then need to call the office and see if the doctor you have chosen will accept you as a new patient and what types of options they offer for patients who are self-pay. Some doctors offer discounts or will set up payment plans for their patients who do not have insurance, but you will need to ask so you aren't surprised when you get to your appointment.  2) Contact Your Local Health Department Not all health departments have doctors that can see  patients for sick visits, but many do, so it is worth a call to see if yours does. If you don't know where your local health department is, you can check in your phone book. The CDC also has a tool to help you locate your state's health department, and many state websites also have listings of all of their local health departments.  3) Find a Walk-in Clinic If your illness is not likely to be very severe or complicated, you may want to try a walk in clinic. These are popping up all over the country in pharmacies, drugstores, and shopping centers. They're usually staffed by nurse practitioners or physician assistants  that have been trained to treat common illnesses and complaints. They're usually fairly quick and inexpensive. However, if you have serious medical issues or chronic medical problems, these are probably not your best option.  No Primary Care Doctor: - Call [facility] Connect at  [phone] - they can help you locate a primary care doctor that  accepts your insurance, provides certain services, etc. - Physician Referral Service- [phone]  Chronic Pain Problems: Organization         Address  Phone   Notes  [facility]  [phone] Patients need to be referred by their primary care doctor.   Medication Assistance: Organization         Address  Phone   Notes  [facility] Medication [facility] [address] [place], [place] [address] [phone] --Must be a resident of [facility] -- Must have NO insurance coverage whatsoever (no Medicaid/ Medicare, etc.) -- The pt. MUST have a primary care doctor that directs their care regularly and follows them in the community   [facility]  [phone]   [place]  [phone]     Dental Care: Organization         Address  Phone  Notes  [facility] Department of [facility] [address] [place], [place] [phone] Accepts children up to age 80 who  are enrolled in [place] or [place] Health Choice; pregnant women with a Medicaid card; and children who have applied for Medicaid or [place] Health Choice, but were declined, whose parents can pay a reduced fee at time of service.  [facility] Department of [facility]  [address], [place] [phone] Accepts children up to age 68 who are enrolled in [place] or [place] Health Choice; pregnant women with a Medicaid card; and children who have applied for Medicaid or [place] Health Choice, but were declined, whose parents can pay a reduced fee at time of service.  [facility]  [address] [place], [place] [phone] Patients are seen by appointment only. Walk-ins are not accepted. [facility] will see patients 84 years of age and older. Monday - Tuesday (8am-5pm) Most Wednesdays (8:30-5pm) $30 per visit, cash only  [facility]  [address], [place] [phone] Patients are seen by appointment only. Walk-ins are not accepted. [facility] will see patients 51 years of age and older. One Wednesday Evening (Monthly: Volunteer Based).  $30 per visit, cash only  [employer]  [phone] for adults; Children under age 42, call Graduate Pediatric Dentistry at [phone]. Children aged 31-14, please call [phone] to request a pediatric application.  Dental services are provided in all areas of dental care including fillings, crowns and bridges, complete and partial dentures, implants, gum treatment, root canals, and extractions. Preventive care is also provided. Treatment is provided to both adults and children. Patients are selected via a lottery and there is often a waiting list.   [facility] [address], [place]  [phone] [email or web address]   [facility] [address] [place], [place] [phone], Ext. [phone] Second and Fourth Thursday of each month,  opens at 6:30 AM; Clinic ends at 9 AM.  Patients are seen on a first-come first-served basis, and a limited number are seen during each clinic.   [facility]  [address], [place]  [place], [place] [phone]   Eligibility Requirements You must have lived in [place], [place], or [place] counties for at least the last three months.   You cannot be eligible for state or federal sponsored [place], including [employer], [place], or [employer].   You generally cannot be eligible for healthcare insurance through your employer.    How to apply: Eligibility screenings are held every Tuesday and Wednesday afternoon from 1:00 pm until 4:00 pm. You do not need an appointment for the interview!  [facility] [address] [place], [place] [phone]   [facility] Health Department  [phone]   [facility] Health Department  [phone]   [facility] Health Department  [phone]

## 2015-07-25 NOTE — ED Notes (Addendum)
C/o R lower toothache and swelling to R side of face x 2 weeks.  Also wants to be checked for ? Yeast infection.

## 2015-07-26 ENCOUNTER — Encounter (HOSPITAL_COMMUNITY): Payer: Self-pay | Admitting: Emergency Medicine

## 2015-07-26 ENCOUNTER — Emergency Department (HOSPITAL_COMMUNITY)
Admission: EM | Admit: 2015-07-26 | Discharge: 2015-07-26 | Disposition: A | Discharge status: Home / Self Care | Payer: Self-pay | Attending: Emergency Medicine | Admitting: Emergency Medicine

## 2015-07-26 DIAGNOSIS — Z79899 Other long term (current) drug therapy: Secondary | ICD-10-CM | POA: Insufficient documentation

## 2015-07-26 DIAGNOSIS — K047 Periapical abscess without sinus: Secondary | ICD-10-CM | POA: Insufficient documentation

## 2015-07-26 MED ORDER — CLINDAMYCIN HCL 300 MG PO CAPS: 300.0000 mg | ORAL_CAPSULE | Freq: Three times a day (TID) | ORAL | Status: DC

## 2015-07-26 MED ORDER — HYDROCODONE-ACETAMINOPHEN 5-325 MG PO TABS: 1.0000 | ORAL_TABLET | Freq: Once | ORAL | Status: DC

## 2015-07-26 MED ORDER — NAPROXEN 250 MG PO TABS
500.0000 mg | ORAL_TABLET | Freq: Once | ORAL | Status: AC
  Administered 2015-07-26: 500 mg via ORAL
  Filled 2015-07-26: qty 2

## 2015-07-26 MED ORDER — HYDROCODONE-ACETAMINOPHEN 5-325 MG PO TABS
1.0000 | ORAL_TABLET | Freq: Once | ORAL | Status: AC
  Administered 2015-07-26: 1 via ORAL
  Filled 2015-07-26: qty 1

## 2015-07-26 MED ORDER — NAPROXEN 375 MG PO TABS: 375.0000 mg | ORAL_TABLET | Freq: Once | ORAL | Status: DC

## 2015-07-26 MED ORDER — NAPROXEN 250 MG PO TABS
375.0000 mg | ORAL_TABLET | Freq: Once | ORAL | Status: AC
  Administered 2015-07-26: 375 mg via ORAL
  Filled 2015-07-26: qty 2

## 2015-07-26 MED ORDER — CLINDAMYCIN HCL 150 MG PO CAPS
300.0000 mg | ORAL_CAPSULE | Freq: Once | ORAL | Status: AC
  Administered 2015-07-26: 300 mg via ORAL
  Filled 2015-07-26: qty 2

## 2015-07-26 NOTE — ED Provider Notes (Signed)
CSN: [id number]     Arrival date & time 07/26/15  0315 History  By signing my name below, I, Tiffany Ball, attest that this documentation has been prepared under the direction and in the presence of Derwood KaplanAnkit Chloe Baig, MD. Electronically Signed: Evon Slackerrance Ball, ED Scribe. 07/26/2015. 4:00 AM.     Chief Complaint  Patient presents with  . Dental Pain   Patient is a 25 y.o. female presenting with tooth pain. The history is provided by the patient. No language interpreter was used.  Dental Pain  HPI Comments: Tiffany Ball is a 25 y.o. female who presents to the Emergency Department complaining of worsening right sided dental pain onset 2 weeks prior. Pt states that the the pain is radiating to her right ear. Pt states she has tried tylenol with no relief. Pt denies fever, nausea vomiting or trouble swallowing.    History reviewed. No pertinent past medical history. History reviewed. No pertinent past surgical history. No family history on file. Social History  Substance Use Topics  . Smoking status: Never Smoker   . Smokeless tobacco: None  . Alcohol Use: No   OB History    No data available     Review of Systems A complete 10 system review of systems was obtained and all systems are negative except as noted in the HPI and PMH.     Allergies  Review of patient's allergies indicates no known allergies.  Home Medications   Prior to Admission medications   Medication Sig Start Date End Date Taking? Authorizing Provider  ondansetron (ZOFRAN) 4 MG tablet Take 1 tablet (4 mg total) by mouth every 8 (eight) hours as needed for nausea or vomiting. 03/11/15  Yes Charm RingsErin J Honig, MD  clindamycin (CLEOCIN) 300 MG capsule Take 1 capsule (300 mg total) by mouth 3 (three) times daily. 07/26/15   Derwood KaplanAnkit Harley Mccartney, MD  fluconazole (DIFLUCAN) 150 MG tablet Take 1 tablet (150 mg total) by mouth once. Take one tablet tonight (07/25/15) and one tablet after completing the antibiotics in 1  week Patient not taking: Reported on 07/26/2015 07/25/15   Mercedes Camprubi-Soms, PA-C  HYDROcodone-acetaminophen (NORCO/VICODIN) 5-325 MG tablet Take 1 tablet by mouth once. 07/26/15   Derwood KaplanAnkit Zael Shuman, MD  naproxen (NAPROSYN) 375 MG tablet Take 1 tablet (375 mg total) by mouth once. 07/26/15   Derwood KaplanAnkit Yuriana Gaal, MD  penicillin v potassium (VEETID) 500 MG tablet Take 2 tablets (1,000 mg total) by mouth 2 (two) times daily. X 7 days Patient not taking: Reported on 07/26/2015 07/25/15   Mercedes Camprubi-Soms, PA-C   BP 116/80 mmHg  Pulse 83  Temp(Src) 99.2 F (37.3 C) (Oral)  Resp 18  Ht 5\' 3"  (1.6 m)  SpO2 100%  LMP 06/29/2015   Physical Exam  Constitutional: She is oriented to person, place, and time. She appears well-developed and well-nourished. No distress.  HENT:  Head: Normocephalic and atraumatic.  Mouth/Throat: No trismus in the jaw.  TTP to teeth 1-4 and 32. No gingival abscess noted.    Eyes: Conjunctivae and EOM are normal.  Neck: Neck supple. No tracheal deviation present.  Cardiovascular: Normal rate.   Pulmonary/Chest: Effort normal. No respiratory distress.  Musculoskeletal: Normal range of motion.  Lymphadenopathy:       Head (right side): Posterior auricular adenopathy present. No preauricular adenopathy present.       Head (left side): Posterior auricular adenopathy present. No preauricular adenopathy present.    She has no cervical adenopathy.  Neurological: She is alert and oriented  to person, place, and time.  Skin: Skin is warm and dry.  Psychiatric: She has a normal mood and affect. Her behavior is normal.  Nursing note and vitals reviewed.   ED Course  Procedures (including critical care time) DIAGNOSTIC STUDIES: Oxygen Saturation is 100% on RA, normal by my interpretation.    COORDINATION OF CARE: 4:03 AM-Discussed treatment plan with pt at bedside and pt agreed to plan.     Labs Review Labs Reviewed - No data to display  Imaging Review No results  found.    EKG Interpretation None      MDM   Final diagnoses:  Dental abscess     I personally performed the services described in this documentation, which was scribed in my presence. The recorded information has been reviewed and is accurate.  Pt comes in with cc of toothache. Pt was here just few hours ago and was discharged with pen v k. She went home and ate food, symptoms got worse. Pain on the R side - top and bottom quadrant. Pain in the jaw. Ear fullness present. No trismus, no meningismus, non toxic patient, no abscess for [place] to drain, no mastoid tenderness. She does have some facial swelling - we will ask her to take clinda instead on pen v k.     Derwood Kaplan, MD 07/26/15 [phone]

## 2015-07-26 NOTE — Discharge Instructions (Signed)
We saw you in the ER for the toothache. No evidence of deep infection, no evidence of pus that can be drained out. YOU WILL NEED TO SEE A DENTIST to get appropriate care. Please take the antibiotics and pain meds provided in the interval.  Return to the ER if you get worsening swelling of the face, fevers, confusion, severe headache or neck pain, seizures.   Dental Abscess A dental abscess is a collection of pus in or around a tooth. CAUSES This condition is caused by a bacterial infection around the root of the tooth that involves the inner part of the tooth (pulp). It may result from:  Severe tooth decay.  Trauma to the tooth that allows bacteria to enter into the pulp, such as a broken or chipped tooth.  Severe gum disease around a tooth. SYMPTOMS Symptoms of this condition include:  Severe pain in and around the infected tooth.  Swelling and redness around the infected tooth, in the mouth, or in the face.  Tenderness.  Pus drainage.  Bad breath.  Bitter taste in the mouth.  Difficulty swallowing.  Difficulty opening the mouth.  Nausea.  Vomiting.  Chills.  Swollen neck glands.  Fever. DIAGNOSIS This condition is diagnosed with examination of the infected tooth. During the exam, your dentist may tap on the infected tooth. Your dentist will also ask about your medical and dental history and may order X-rays. TREATMENT This condition is treated by eliminating the infection. This may be done with:  Antibiotic medicine.  A root canal. This may be performed to save the tooth.  Pulling (extracting) the tooth. This may also involve draining the abscess. This is done if the tooth cannot be saved. HOME CARE INSTRUCTIONS  Take medicines only as directed by your dentist.  If you were prescribed antibiotic medicine, finish all of it even if you start to feel better.  Rinse your mouth (gargle) often with salt water to relieve pain or swelling.  Do not drive or  operate heavy machinery while taking pain medicine.  Do not apply heat to the outside of your mouth.  Keep all follow-up visits as directed by your dentist. This is important. SEEK MEDICAL CARE IF:  Your pain is worse and is not helped by medicine. SEEK IMMEDIATE MEDICAL CARE IF:  You have a fever or chills.  Your symptoms suddenly get worse.  You have a very bad headache.  You have problems breathing or swallowing.  You have trouble opening your mouth.  You have swelling in your neck or around your eye.   This information is not intended to replace advice given to you by your health care provider. Make sure you discuss any questions you have with your health care provider.   Document Released: 03/15/2005 Document Revised: 07/30/2014 Document Reviewed: 03/12/2014 Elsevier Interactive Patient Education [employer] Elsevier Inc.

## 2015-07-26 NOTE — ED Notes (Signed)
Pt verbalized understanding of discharge instructions and follow up care

## 2015-07-26 NOTE — ED Notes (Signed)
Per pt, she was seen yesterday in this ED for a cracked tooth, per pt once she got home and tried to eat she noticed a tooth on the right side of her mouth was hurting as well.

## 2015-07-29 ENCOUNTER — Encounter (HOSPITAL_COMMUNITY): Payer: Self-pay | Admitting: Emergency Medicine

## 2015-09-09 ENCOUNTER — Encounter (HOSPITAL_COMMUNITY): Payer: Self-pay | Admitting: Emergency Medicine

## 2015-09-09 ENCOUNTER — Ambulatory Visit (HOSPITAL_COMMUNITY)
Admission: EM | Admit: 2015-09-09 | Discharge: 2015-09-09 | Disposition: A | Discharge status: Home / Self Care | Payer: Self-pay | Attending: Family Medicine | Admitting: Family Medicine

## 2015-09-09 ENCOUNTER — Ambulatory Visit (INDEPENDENT_AMBULATORY_CARE_PROVIDER_SITE_OTHER): Payer: Self-pay

## 2015-09-09 DIAGNOSIS — T7840XA Allergy, unspecified, initial encounter: Secondary | ICD-10-CM

## 2015-09-09 LAB — POCT I-STAT, CHEM 8
BUN: 5 mg/dL — ABNORMAL LOW (ref 6–20)
CALCIUM ION: 1.25 mmol/L — AB (ref 1.12–1.23)
Chloride: 103 mmol/L (ref 101–111)
Creatinine, Ser: 0.6 mg/dL (ref 0.44–1.00)
GLUCOSE: 78 mg/dL (ref 65–99)
HCT: 35 % — ABNORMAL LOW (ref 36.0–46.0)
HEMOGLOBIN: 11.9 g/dL — AB (ref 12.0–15.0)
POTASSIUM: 3.7 mmol/L (ref 3.5–5.1)
Sodium: 142 mmol/L (ref 135–145)
TCO2: 28 mmol/L (ref 0–100)

## 2015-09-09 MED ORDER — DIPHENHYDRAMINE HCL 12.5 MG/5ML PO ELIX
ORAL_SOLUTION | ORAL | Status: AC
  Filled 2015-09-09: qty 10

## 2015-09-09 MED ORDER — DIPHENHYDRAMINE HCL 12.5 MG/5ML PO ELIX
25.0000 mg | ORAL_SOLUTION | Freq: Once | ORAL | Status: AC
  Administered 2015-09-09: 25 mg via ORAL

## 2015-09-09 NOTE — ED Notes (Signed)
PT ambulates back to room at this time. Given a warm blanket by Blase MessStasha

## 2015-09-09 NOTE — Discharge Instructions (Signed)
Drug Allergy °Allergic reactions to medicines are common. Some allergic reactions are mild. A delayed type of drug allergy that occurs 1 week or more after exposure to a medicine or vaccine is called serum sickness. A life-threatening, sudden (acute) allergic reaction that involves the whole body is called anaphylaxis. °CAUSES  °"True" drug allergies occur when there is an allergic reaction to a medicine. This is caused by overactivity of the immune system. First, the body becomes sensitized. The immune system is triggered by your first exposure to the medicine. Following this first exposure, future exposure to the same medicine may be life-threatening. °Almost any medicine can cause an allergic reaction. Common ones are: °· Penicillin. °· Sulfonamides (sulfa drugs). °· Local anesthetics. °· X-ray dyes that contain iodine. °SYMPTOMS  °Common symptoms of a minor allergic reaction are: °· Swelling around the mouth. °· An itchy red rash or hives. °· Vomiting or diarrhea. °Anaphylaxis can cause swelling of the mouth and throat. This makes it difficult to breathe and swallow. Severe reactions can be fatal within seconds, even after exposure to only a trace amount of the drug that causes the reaction. °HOME CARE INSTRUCTIONS °· If you are unsure of what caused your reaction, write down: °¨ The names of the medicines you took. °¨ How much medicine you took. °¨ How you took the medicine, such as whether you took a pill, injected the medicine, or applied it to your skin. °¨ All of the things you ate and drank. °¨ The date and time of your reaction. °¨ The symptoms of the reaction. °· You may want to follow up with an allergy specialist after the reaction has cleared in order to be tested to confirm the allergy. It is important to confirm that your reaction is an allergy, not just a side effect to the medicine. If you have a true allergy to a medicine, this may prevent that medicine and related medicines from being given to  you when you are very ill. °· If you have hives or a rash: °¨ Take medicines as directed by your caregiver. °¨ You may use an over-the-counter antihistamine (diphenhydramine) as needed. °¨ Apply cold compresses to the skin or take baths in cool water. Avoid hot baths or showers. °· If you are severely allergic: °¨ Continuous observation after a severe reaction may be needed. Hospitalization is often required. °¨ Wear a medical alert bracelet or necklace stating your allergy. °¨ You and your family must learn how to use an anaphylaxis kit or give an epinephrine injection to temporarily treat an emergency allergic reaction. If you have had a severe reaction, always carry your epinephrine injection or anaphylaxis kit with you. This can be lifesaving if you have a severe reaction. °· Do not drive or perform tasks after treatment until the medicines used to treat your reaction have worn off, or until your caregiver says it is okay. °· If you have a drug allergy that was confirmed by your health care provider: °¨ Carry information about the drug allergy with you at all times. °¨ Always check with a pharmacist before taking any over-the-counter medicine. °SEEK MEDICAL CARE IF:  °· You think you had an allergic reaction. Symptoms usually start within 30 minutes after exposure. °· Symptoms are getting worse rather than better. °· You develop new symptoms. °· The symptoms that brought you to your caregiver return. °SEEK IMMEDIATE MEDICAL CARE IF:  °· You have swelling of the mouth, difficulty breathing, or wheezing. °· You have a tight   feeling in your chest or throat.  You develop hives, swelling, or itching all over your body.  You develop severe vomiting or diarrhea.  You feel faint or pass out. This is an emergency. Use your epinephrine injection or anaphylaxis kit as you have been instructed. Call for emergency medical help. Even if you improve after the injection, you need to be examined at a hospital emergency  department. MAKE SURE YOU:   Understand these instructions.  Will watch your condition.  Will get help right away if you are not doing well or get worse.   This information is not intended to replace advice given to you by your health care provider. Make sure you discuss any questions you have with your health care provider.   Document Released: 03/15/2005 Document Revised: 04/05/2014 Document Reviewed: 10/15/2014 Elsevier Interactive Patient Education 2016 [employer]. Anemia, Nonspecific Anemia is a condition in which the concentration of red blood cells or hemoglobin in the blood is below normal. Hemoglobin is a substance in red blood cells that carries oxygen to the tissues of the body. Anemia results in not enough oxygen reaching these tissues.  CAUSES  Common causes of anemia include:   Excessive bleeding. Bleeding may be internal or external. This includes excessive bleeding from periods (in women) or from the intestine.   Poor nutrition.   Chronic kidney, thyroid, and liver disease.  Bone marrow disorders that decrease red blood cell production.  Cancer and treatments for cancer.  HIV, AIDS, and their treatments.  Spleen problems that increase red blood cell destruction.  Blood disorders.  Excess destruction of red blood cells due to infection, medicines, and autoimmune disorders. SIGNS AND SYMPTOMS   Minor weakness.   Dizziness.   Headache.  Palpitations.   Shortness of breath, especially with exercise.   Paleness.  Cold sensitivity.  Indigestion.  Nausea.  Difficulty sleeping.  Difficulty concentrating. Symptoms may occur suddenly or they may develop slowly.  DIAGNOSIS  Additional blood tests are often needed. These help your health care provider determine the best treatment. Your health care provider will check your stool for blood and look for other causes of blood loss.  TREATMENT  Treatment varies depending on the cause of the  anemia. Treatment can include:   Supplements of iron, vitamin P82, or folic acid.   Hormone medicines.   A blood transfusion. This may be needed if blood loss is severe.   Hospitalization. This may be needed if there is significant continual blood loss.   Dietary changes.  Spleen removal. HOME CARE INSTRUCTIONS Keep all follow-up appointments. It often takes many weeks to correct anemia, and having your health care provider check on your condition and your response to treatment is very important. SEEK IMMEDIATE MEDICAL CARE IF:   You develop extreme weakness, shortness of breath, or chest pain.   You become dizzy or have trouble concentrating.  You develop heavy vaginal bleeding.   You develop a rash.   You have bloody or black, tarry stools.   You faint.   You vomit up blood.   You vomit repeatedly.   You have abdominal pain.  You have a fever or persistent symptoms for more than 2-3 days.   You have a fever and your symptoms suddenly get worse.   You are dehydrated.  MAKE SURE YOU:  Understand these instructions.  Will watch your condition.  Will get help right away if you are not doing well or get worse.   This information is not  intended to replace advice given to you by your health care provider. Make sure you discuss any questions you have with your health care provider.   Document Released: 04/22/2004 Document Revised: 11/15/2012 Document Reviewed: 09/08/2012 Elsevier Interactive Patient Education [employer].

## 2015-09-09 NOTE — ED Provider Notes (Signed)
CSN: [id number]     Arrival date & time 09/09/15  1259 History   First MD Initiated Contact with Patient 09/09/15 1321     Chief Complaint  Patient presents with  . Shortness of Breath   (Consider location/radiation/quality/duration/timing/severity/associated sxs/prior Treatment) Patient is a 25 y.o. female presenting with pharyngitis. The history is provided by the patient. No language interpreter was used.  Sore Throat This is a new problem. The problem occurs constantly. The problem has not changed since onset.Pertinent negatives include no abdominal pain. Nothing aggravates the symptoms. Nothing relieves the symptoms. She has tried nothing for the symptoms. The treatment provided no relief.   Pt reports she took plan B yesterday.  Pt reports she feels tightness in her throat.  Pt reports it is hard to swallow. Pt concerned about allergic reaction.  Past Medical History  Diagnosis Date  . Hypercholesteremia   . Vitamin D deficiency    Past Surgical History  Procedure Laterality Date  . Knee cyst  2008  . Knee surgery      right  . Wisdom tooth extraction     No family history on file. Social History  Substance Use Topics  . Smoking status: Never Smoker   . Smokeless tobacco: None  . Alcohol Use: Yes     Comment: social   OB History    Gravida Para Term Preterm AB TAB SAB Ectopic Multiple Living   1 0 0 0 0 0 0 0       Review of Systems  Gastrointestinal: Negative for abdominal pain.  All other systems reviewed and are negative.   Allergies  Review of patient's allergies indicates no known allergies.  Home Medications   Prior to Admission medications   Medication Sig Start Date End Date Taking? Authorizing Provider  azithromycin (ZITHROMAX) 250 MG tablet Take 4 tablets by mouth once 05/01/13   Tereso NewcomerUgonna A Anyanwu, MD  clindamycin (CLEOCIN) 300 MG capsule Take 1 capsule (300 mg total) by mouth 3 (three) times daily. 07/26/15   Derwood KaplanAnkit Nanavati, MD  fluconazole (DIFLUCAN)  150 MG tablet Take 1 tablet (150 mg total) by mouth once. Take one tablet tonight (07/25/15) and one tablet after completing the antibiotics in 1 week Patient not taking: Reported on 07/26/2015 07/25/15   Mercedes Camprubi-Soms, PA-C  HYDROcodone-acetaminophen (NORCO/VICODIN) 5-325 MG tablet Take 1 tablet by mouth once. 07/26/15   Derwood KaplanAnkit Nanavati, MD  naproxen (NAPROSYN) 375 MG tablet Take 1 tablet (375 mg total) by mouth once. 07/26/15   Derwood KaplanAnkit Nanavati, MD  ondansetron (ZOFRAN ODT) 4 MG disintegrating tablet Take 1 tablet (4 mg total) by mouth every 8 (eight) hours as needed for nausea or vomiting. 04/30/13   Duane LopeJennifer I Rasch, NP  ondansetron (ZOFRAN) 4 MG tablet Take 1 tablet (4 mg total) by mouth every 8 (eight) hours as needed for nausea or vomiting. 03/11/15   Charm RingsErin J Honig, MD  penicillin v potassium (VEETID) 500 MG tablet Take 2 tablets (1,000 mg total) by mouth 2 (two) times daily. X 7 days Patient not taking: Reported on 07/26/2015 07/25/15   Mercedes Camprubi-Soms, PA-C  Prenatal Vit-Fe Fumarate-FA (PRENATAL MULTIVITAMIN) TABS tablet Take 1 tablet by mouth daily at 12 noon.    Historical Provider, MD  promethazine (PHENERGAN) 25 MG tablet Take 1 tablet (25 mg total) by mouth every 6 (six) hours as needed for nausea or vomiting. 04/30/13   Duane LopeJennifer I Rasch, NP  traMADol (ULTRAM) 50 MG tablet Take 1 tablet (50 mg total) by mouth  every 6 (six) hours as needed. 10/04/13   Junious Silk, PA-C   Meds Ordered and Administered this Visit   Medications  diphenhydrAMINE (BENADRYL) 12.5 MG/5ML elixir 25 mg (25 mg Oral Given 09/09/15 1340)    BP 126/85 mmHg  Pulse 86  Temp(Src) 98.5 F (36.9 C) (Oral)  Resp 16  Ht 5' (1.524 m)  Wt 101 lb (45.813 kg)  BMI 19.73 kg/m2  SpO2 98%  LMP 08/31/2015 No data found.   Physical Exam  Constitutional: She is oriented to person, place, and time. She appears well-developed and well-nourished.  HENT:  Head: Normocephalic and atraumatic.  Mouth/Throat: Oropharynx  is clear and moist.  Eyes: Conjunctivae and EOM are normal. Pupils are equal, round, and reactive to light.  Neck: Normal range of motion.  Cardiovascular: Normal rate and normal heart sounds.   Pulmonary/Chest: Effort normal.  Abdominal: She exhibits no distension.  Musculoskeletal: Normal range of motion.  Neurological: She is alert and oriented to person, place, and time.  Skin: Skin is warm.  Psychiatric: She has a normal mood and affect.  Nursing note and vitals reviewed.   ED Course  Procedures (including critical care time)  Labs Review Labs Reviewed - No data to display  Imaging Review Dg Neck Soft Tissue  09/09/2015  CLINICAL DATA:  Throat tightness.  Difficulty swallowing. EXAM: NECK SOFT TISSUES - 1+ VIEW COMPARISON:  None. FINDINGS: There is no evidence of retropharyngeal soft tissue swelling or epiglottic enlargement. The cervical airway is unremarkable and no radio-opaque foreign body identified. IMPRESSION: Negative. Electronically Signed   By: Marlan Palau M.D.   On: 09/09/2015 14:21     Visual Acuity Review  Right Eye Distance:   Left Eye Distance:   Bilateral Distance:    Right Eye Near:   Left Eye Near:    Bilateral Near:         MDM Pt given benadryl po  Observed.  Pt reports she feels better.  Soft tissue neck is normal.     1. Allergic reaction, initial encounter    Pt advised benadryl x 24 hours.   An After Visit Summary was printed and given to the patient.   Tiffany Skinner [place], PA-C 09/09/15 1722

## 2015-09-09 NOTE — ED Notes (Signed)
PT reports she took a Plan B pill around 5 pm yesterday. PT reports that this morning around 7am she developed SOB and chest pain. PT describes chest pain as pressure that worse with a deep breath. PT reports it feels as though her throat is tight and it feels "clogged up" when she tries to swallow.

## 2015-09-09 NOTE — ED Notes (Signed)
PT reports her throat is feeling less tight and she is feeling better. PT has no requests at this time.

## 2016-04-25 ENCOUNTER — Encounter (HOSPITAL_COMMUNITY): Payer: Self-pay | Admitting: Emergency Medicine

## 2016-04-25 ENCOUNTER — Ambulatory Visit (HOSPITAL_COMMUNITY)
Admission: EM | Admit: 2016-04-25 | Discharge: 2016-04-25 | Disposition: A | Discharge status: Home / Self Care | Payer: Self-pay | Attending: Family Medicine | Admitting: Family Medicine

## 2016-04-25 DIAGNOSIS — R1115 Cyclical vomiting syndrome unrelated to migraine: Secondary | ICD-10-CM

## 2016-04-25 DIAGNOSIS — G43A0 Cyclical vomiting, not intractable: Secondary | ICD-10-CM

## 2016-04-25 DIAGNOSIS — K529 Noninfective gastroenteritis and colitis, unspecified: Secondary | ICD-10-CM

## 2016-04-25 DIAGNOSIS — G44209 Tension-type headache, unspecified, not intractable: Secondary | ICD-10-CM

## 2016-04-25 MED ORDER — ONDANSETRON 4 MG PO TBDP
ORAL_TABLET | ORAL | Status: AC
  Filled 2016-04-25: qty 1

## 2016-04-25 MED ORDER — DICLOFENAC SODIUM 50 MG PO TBEC: DELAYED_RELEASE_TABLET | ORAL | 0 refills | Status: DC

## 2016-04-25 MED ORDER — ONDANSETRON 4 MG PO TBDP: 4.0000 mg | ORAL_TABLET | Freq: Three times a day (TID) | ORAL | 0 refills | Status: DC | PRN

## 2016-04-25 MED ORDER — ONDANSETRON 4 MG PO TBDP
4.0000 mg | ORAL_TABLET | Freq: Once | ORAL | Status: AC
  Administered 2016-04-25: 4 mg via ORAL

## 2016-04-25 NOTE — ED Provider Notes (Signed)
CSN: [id number]     Arrival date & time 04/25/16  1629 History   First MD Initiated Contact with Patient 04/25/16 1703     Chief Complaint  Patient presents with  . Emesis   (Consider location/radiation/quality/duration/timing/severity/associated sxs/prior Treatment) Patient c/o nausea and vomiting and headache.     The history is provided by the patient.  Emesis  Severity:  Moderate Duration:  2 days Timing:  Intermittent Quality:  Stomach contents Able to tolerate:  Liquids How soon after eating does vomiting occur:  5 minutes Progression:  Worsening Chronicity:  New Recent urination:  Normal Relieved by:  Nothing Worsened by:  Nothing Associated symptoms: abdominal pain     Past Medical History:  Diagnosis Date  . Hypercholesteremia   . Vitamin D deficiency    Past Surgical History:  Procedure Laterality Date  . knee cyst  2008  . KNEE SURGERY     right  . WISDOM TOOTH EXTRACTION     No family history on file. Social History  Substance Use Topics  . Smoking status: Never Smoker  . Smokeless tobacco: Not on file  . Alcohol use Yes     Comment: social   OB History    Gravida Para Term Preterm AB Living   1 0 0 0 0     SAB TAB Ectopic Multiple Live Births   0 0 0         Review of Systems  Constitutional: Positive for fatigue.  HENT: Negative.   Eyes: Negative.   Respiratory: Negative.   Cardiovascular: Negative.   Gastrointestinal: Positive for abdominal pain and vomiting.  Endocrine: Negative.   Genitourinary: Negative.   Musculoskeletal: Negative.   Allergic/Immunologic: Negative.   Neurological: Negative.   Hematological: Negative.   Psychiatric/Behavioral: Negative.     Allergies  Plan b [levonorgestrel]  Home Medications   Prior to Admission medications   Medication Sig Start Date End Date Taking? Authorizing Provider  azithromycin (ZITHROMAX) 250 MG tablet Take 4 tablets by mouth once Patient not taking: Reported on 04/25/2016 05/01/13    Tereso Newcomer, MD  clindamycin (CLEOCIN) 300 MG capsule Take 1 capsule (300 mg total) by mouth 3 (three) times daily. Patient not taking: Reported on 04/25/2016 07/26/15   Derwood Kaplan, MD  diclofenac (VOLTAREN) 50 MG EC tablet One po tid prn headache 04/25/16   Deatra Canter, FNP  fluconazole (DIFLUCAN) 150 MG tablet Take 1 tablet (150 mg total) by mouth once. Take one tablet tonight (07/25/15) and one tablet after completing the antibiotics in 1 week Patient not taking: Reported on 07/26/2015 07/25/15   Donnita Falls [address], PA-C  HYDROcodone-acetaminophen (NORCO/VICODIN) 5-325 MG tablet Take 1 tablet by mouth once. 07/26/15   Derwood Kaplan, MD  naproxen (NAPROSYN) 375 MG tablet Take 1 tablet (375 mg total) by mouth once. 07/26/15   Derwood Kaplan, MD  ondansetron (ZOFRAN ODT) 4 MG disintegrating tablet Take 1 tablet (4 mg total) by mouth every 8 (eight) hours as needed for nausea or vomiting. 04/30/13   Duane Lope, NP  ondansetron (ZOFRAN ODT) 4 MG disintegrating tablet Take 1 tablet (4 mg total) by mouth every 8 (eight) hours as needed for nausea or vomiting. 04/25/16   Deatra Canter, FNP  ondansetron (ZOFRAN) 4 MG tablet Take 1 tablet (4 mg total) by mouth every 8 (eight) hours as needed for nausea or vomiting. 03/11/15   Charm Rings, MD  penicillin v potassium (VEETID) 500 MG tablet Take 2 tablets (1,000  mg total) by mouth 2 (two) times daily. X 7 days Patient not taking: Reported on 07/26/2015 07/25/15   Donnita FallsMercedes Strupp Street, PA-C  Prenatal Vit-Fe Fumarate-FA (PRENATAL MULTIVITAMIN) TABS tablet Take 1 tablet by mouth daily at 12 noon.    Historical Provider, MD  promethazine (PHENERGAN) 25 MG tablet Take 1 tablet (25 mg total) by mouth every 6 (six) hours as needed for nausea or vomiting. 04/30/13   Duane LopeJennifer I Rasch, NP  traMADol (ULTRAM) 50 MG tablet Take 1 tablet (50 mg total) by mouth every 6 (six) hours as needed. 10/04/13   Junious SilkHannah Merrell, PA-C   Meds Ordered and Administered  this Visit   Medications  ondansetron (ZOFRAN-ODT) disintegrating tablet 4 mg (not administered)    BP 112/79 (BP Location: Left Arm)   Pulse 92   Temp 98.2 F (36.8 C) (Oral)   Resp 16   LMP 04/13/2016   SpO2 100%  No data found.   Physical Exam  Constitutional: She appears well-developed and well-nourished.  HENT:  Head: Normocephalic and atraumatic.  Right Ear: External ear normal.  Left Ear: External ear normal.  Mouth/Throat: Oropharynx is clear and moist.  Eyes: Conjunctivae and EOM are normal. Pupils are equal, round, and reactive to light.  Neck: Normal range of motion. Neck supple.  Cardiovascular: Normal rate, regular rhythm and normal heart sounds.   Pulmonary/Chest: Effort normal and breath sounds normal.  Abdominal: Soft. Bowel sounds are normal.  Nursing note and vitals reviewed.   Urgent Care Course     Procedures (including critical care time)  Labs Review Labs Reviewed - No data to display  Imaging Review No results found.   Visual Acuity Review  Right Eye Distance:   Left Eye Distance:   Bilateral Distance:    Right Eye Near:   Left Eye Near:    Bilateral Near:         MDM   1. Gastroenteritis   2. Non-intractable cyclical vomiting with nausea   3. Tension-type headache, not intractable, unspecified chronicity pattern    Diclofenac 50mg  one po tid prn headache #15 Zofran ODT 4mg  one now rx Zofran ODT 4mg  one po tid prn #21  Push po fluids, rest, tylenol otc prn as directed for fever, arthralgias, and myalgias.  Follow up prn if sx's continue or persist.    Deatra CanterWilliam J Lawan Nanez, FNP 04/25/16 [phone]

## 2016-04-25 NOTE — ED Triage Notes (Signed)
Intermittent abdominal soreness with specific activities.  Patient has a persistent headache including left side of head and left eye

## 2017-02-21 ENCOUNTER — Other Ambulatory Visit: Payer: Self-pay

## 2017-02-21 ENCOUNTER — Encounter (HOSPITAL_COMMUNITY): Payer: Self-pay | Admitting: Emergency Medicine

## 2017-02-21 DIAGNOSIS — R110 Nausea: Secondary | ICD-10-CM | POA: Insufficient documentation

## 2017-02-21 DIAGNOSIS — Z5321 Procedure and treatment not carried out due to patient leaving prior to being seen by health care provider: Secondary | ICD-10-CM | POA: Insufficient documentation

## 2017-02-21 NOTE — ED Triage Notes (Signed)
Pt reports cold like S/S X2 weeks, nausea, dec appetite.

## 2017-02-22 ENCOUNTER — Emergency Department (HOSPITAL_COMMUNITY)
Admission: EM | Admit: 2017-02-22 | Discharge: 2017-02-22 | Disposition: A | Discharge status: Home / Self Care | Payer: Self-pay | Attending: Emergency Medicine | Admitting: Emergency Medicine

## 2017-02-22 LAB — URINALYSIS, ROUTINE W REFLEX MICROSCOPIC
Bilirubin Urine: NEGATIVE
Glucose, UA: NEGATIVE mg/dL
Hgb urine dipstick: NEGATIVE
KETONES UR: NEGATIVE mg/dL
Leukocytes, UA: NEGATIVE
Nitrite: POSITIVE — AB
PROTEIN: NEGATIVE mg/dL
RBC / HPF: NONE SEEN RBC/hpf (ref 0–5)
Specific Gravity, Urine: 1.012 (ref 1.005–1.030)
pH: 6 (ref 5.0–8.0)

## 2017-02-22 LAB — CBC
HCT: 38.8 % (ref 36.0–46.0)
HEMOGLOBIN: 12.8 g/dL (ref 12.0–15.0)
MCH: 27.9 pg (ref 26.0–34.0)
MCHC: 33 g/dL (ref 30.0–36.0)
MCV: 84.5 fL (ref 78.0–100.0)
Platelets: 316 10*3/uL (ref 150–400)
RBC: 4.59 MIL/uL (ref 3.87–5.11)
RDW: 12.2 % (ref 11.5–15.5)
WBC: 5.1 10*3/uL (ref 4.0–10.5)

## 2017-02-22 LAB — COMPREHENSIVE METABOLIC PANEL
ALK PHOS: 49 U/L (ref 38–126)
ALT: 9 U/L — AB (ref 14–54)
AST: 19 U/L (ref 15–41)
Albumin: 4.4 g/dL (ref 3.5–5.0)
Anion gap: 9 (ref 5–15)
BILIRUBIN TOTAL: 1.2 mg/dL (ref 0.3–1.2)
BUN: 7 mg/dL (ref 6–20)
CALCIUM: 9.6 mg/dL (ref 8.9–10.3)
CHLORIDE: 104 mmol/L (ref 101–111)
CO2: 25 mmol/L (ref 22–32)
CREATININE: 0.64 mg/dL (ref 0.44–1.00)
GFR calc Af Amer: >60 mL/min (ref >60)
Glucose, Bld: 104 mg/dL — ABNORMAL HIGH (ref 65–99)
Potassium: 3.5 mmol/L (ref 3.5–5.1)
Sodium: 138 mmol/L (ref 135–145)
Total Protein: 7.8 g/dL (ref 6.5–8.1)

## 2017-02-22 LAB — I-STAT BETA HCG BLOOD, ED (MC, WL, AP ONLY)

## 2017-02-22 LAB — LIPASE, BLOOD: Lipase: 21 U/L (ref 11–51)

## 2017-02-22 MED ORDER — ONDANSETRON 4 MG PO TBDP
4.0000 mg | ORAL_TABLET | Freq: Once | ORAL | Status: AC
  Administered 2017-02-22: 4 mg via ORAL

## 2017-02-22 MED ORDER — ONDANSETRON HCL 4 MG PO TABS
4.0000 mg | ORAL_TABLET | Freq: Once | ORAL | Status: DC
Start: 2017-02-22 — End: 2017-02-22

## 2017-02-22 MED ORDER — IBUPROFEN 400 MG PO TABS
400.0000 mg | ORAL_TABLET | Freq: Once | ORAL | Status: AC
  Administered 2017-02-22: 400 mg via ORAL

## 2017-02-22 MED ORDER — ONDANSETRON 4 MG PO TBDP
ORAL_TABLET | ORAL | Status: AC
  Filled 2017-02-22: qty 1

## 2017-02-22 NOTE — ED Notes (Signed)
Pt states that she does not want to be seen due to wait times  

## 2017-02-25 ENCOUNTER — Other Ambulatory Visit: Payer: Self-pay

## 2017-02-25 ENCOUNTER — Ambulatory Visit (HOSPITAL_COMMUNITY)
Admission: EM | Admit: 2017-02-25 | Discharge: 2017-02-25 | Disposition: A | Discharge status: Home / Self Care | Payer: Self-pay | Attending: Family Medicine | Admitting: Family Medicine

## 2017-02-25 ENCOUNTER — Encounter (HOSPITAL_COMMUNITY): Payer: Self-pay | Admitting: Emergency Medicine

## 2017-02-25 DIAGNOSIS — N939 Abnormal uterine and vaginal bleeding, unspecified: Secondary | ICD-10-CM | POA: Insufficient documentation

## 2017-02-25 DIAGNOSIS — Z79899 Other long term (current) drug therapy: Secondary | ICD-10-CM | POA: Insufficient documentation

## 2017-02-25 DIAGNOSIS — E559 Vitamin D deficiency, unspecified: Secondary | ICD-10-CM | POA: Insufficient documentation

## 2017-02-25 DIAGNOSIS — R110 Nausea: Secondary | ICD-10-CM | POA: Insufficient documentation

## 2017-02-25 DIAGNOSIS — S39012A Strain of muscle, fascia and tendon of lower back, initial encounter: Secondary | ICD-10-CM | POA: Insufficient documentation

## 2017-02-25 DIAGNOSIS — X58XXXA Exposure to other specified factors, initial encounter: Secondary | ICD-10-CM | POA: Insufficient documentation

## 2017-02-25 DIAGNOSIS — E7800 Pure hypercholesterolemia, unspecified: Secondary | ICD-10-CM | POA: Insufficient documentation

## 2017-02-25 DIAGNOSIS — R102 Pelvic and perineal pain: Secondary | ICD-10-CM | POA: Insufficient documentation

## 2017-02-25 DIAGNOSIS — K5901 Slow transit constipation: Secondary | ICD-10-CM | POA: Insufficient documentation

## 2017-02-25 LAB — POCT PREGNANCY, URINE: Preg Test, Ur: NEGATIVE

## 2017-02-25 LAB — POCT I-STAT, CHEM 8
BUN: 9 mg/dL (ref 6–20)
CHLORIDE: 105 mmol/L (ref 101–111)
CREATININE: 0.5 mg/dL (ref 0.44–1.00)
Calcium, Ion: 1.25 mmol/L (ref 1.15–1.40)
GLUCOSE: 88 mg/dL (ref 65–99)
HEMATOCRIT: 36 % (ref 36.0–46.0)
Hemoglobin: 12.2 g/dL (ref 12.0–15.0)
POTASSIUM: 4.2 mmol/L (ref 3.5–5.1)
Sodium: 139 mmol/L (ref 135–145)
TCO2: 25 mmol/L (ref 22–32)

## 2017-02-25 LAB — POCT URINALYSIS DIP (DEVICE)
BILIRUBIN URINE: NEGATIVE
Glucose, UA: NEGATIVE mg/dL
KETONES UR: NEGATIVE mg/dL
Leukocytes, UA: NEGATIVE
Nitrite: POSITIVE — AB
PH: 5.5 (ref 5.0–8.0)
Protein, ur: NEGATIVE mg/dL
Specific Gravity, Urine: >=1.03 (ref 1.005–1.030)
Urobilinogen, UA: 0.2 mg/dL (ref 0.0–1.0)

## 2017-02-25 NOTE — Discharge Instructions (Signed)
The bleeding is likely due to the Depo-Provera shot. Follow-up with the health department or the [facility]. You are not anemic at this time. The sure to drink plenty of fluids. Your back pain is due to muscle strain from recent lifting. It is likely that the pain in the left lower abdomen is due to retained stool and gas. Recommend taking MiraLAX with fluid of choice to help relieve constipation. For worsening go to emergency department. May return as needed. Pain suggest negative. No sign of urinary tract infection.

## 2017-02-25 NOTE — ED Provider Notes (Signed)
[facility]    CSN: [id number] Arrival date & time: 02/25/17  1220     History   Chief Complaint Chief Complaint  Patient presents with  . Flank Pain  . Vaginal Bleeding    HPI Tiffany Ball is a 26 y.o. female.   26 year old female with history of Depo-Provera for birth control presents with a two-week history of upper respiratory symptoms that have since improved associated with nausea and left pelvic pain. She went to the emergency department, had some blood work drawn but because she had to go to work she left without being seen. States she has been having vaginal bleeding for about 3 months. Tends to wax and wane in volume. She continues to be sexually active with last intercourse was one week ago. Continues to have left pelvic pain. She took 800 mg of ibuprofen last night and the pain in the left pelvis and left back abated. Today she is complaining of back pain that was to the right lower para lumbar muscle yesterday but not today and now the left paralumbar musculature. Pain is worse with movement calming turning of the torso and leaning forward. She is denying urinary symptoms.      Past Medical History:  Diagnosis Date  . Hypercholesteremia   . Vitamin D deficiency     There are no active problems to display for this patient.   Past Surgical History:  Procedure Laterality Date  . knee cyst  2008  . KNEE SURGERY     right  . WISDOM TOOTH EXTRACTION      OB History    Gravida Para Term Preterm AB Living   1 0 0 0 0     SAB TAB Ectopic Multiple Live Births   0 0 0           Home Medications    Prior to Admission medications   Medication Sig Start Date End Date Taking? Authorizing Provider  naproxen (NAPROSYN) 375 MG tablet Take 1 tablet (375 mg total) by mouth once. 07/26/15   Derwood KaplanNanavati, Ankit, MD  ondansetron (ZOFRAN ODT) 4 MG disintegrating tablet Take 1 tablet (4 mg total) by mouth every 8 (eight) hours as needed for nausea or  vomiting. 04/30/13   Rasch, Victorino DikeJennifer I, NP  ondansetron (ZOFRAN ODT) 4 MG disintegrating tablet Take 1 tablet (4 mg total) by mouth every 8 (eight) hours as needed for nausea or vomiting. 04/25/16   Deatra Canterxford, William J, FNP  ondansetron (ZOFRAN) 4 MG tablet Take 1 tablet (4 mg total) by mouth every 8 (eight) hours as needed for nausea or vomiting. 03/11/15   Charm RingsHonig, Erin J, MD  Prenatal Vit-Fe Fumarate-FA (PRENATAL MULTIVITAMIN) TABS tablet Take 1 tablet by mouth daily at 12 noon.    [provider]  promethazine (PHENERGAN) 25 MG tablet Take 1 tablet (25 mg total) by mouth every 6 (six) hours as needed for nausea or vomiting. 04/30/13   Rasch, Victorino DikeJennifer I, NP  traMADol (ULTRAM) 50 MG tablet Take 1 tablet (50 mg total) by mouth every 6 (six) hours as needed. 10/04/13   Junious SilkMerrell, Hannah, PA-C    Family History No family history on file.  Social History Social History   Tobacco Use  . Smoking status: Never Smoker  Substance Use Topics  . Alcohol use: Yes    Comment: social  . Drug use: No     Allergies   Plan b [levonorgestrel]   Review of Systems Review of Systems  Constitutional: Negative.  Negative for fever.  HENT: Positive for congestion.   Respiratory: Negative.   Gastrointestinal: Positive for nausea.  Genitourinary: Positive for menstrual problem, pelvic pain and vaginal bleeding. Negative for dysuria and frequency.  Neurological: Negative.   All other systems reviewed and are negative.    Physical Exam Triage Vital Signs ED Triage Vitals  Enc Vitals Group     BP 02/25/17 1245 123/82     Pulse Rate 02/25/17 1245 86     Resp 02/25/17 1245 18     Temp 02/25/17 1245 98.5 F (36.9 C)     Temp src --      SpO2 02/25/17 1245 100 %     Weight --      Height --      Head Circumference --      Peak Flow --      Pain Score 02/25/17 1247 8     Pain Loc --      Pain Edu? --      Excl. in GC? --    No data found.  Updated Vital Signs BP 123/82   Pulse 86   Temp  98.5 F (36.9 C)   Resp 18   SpO2 100%   Visual Acuity Right Eye Distance:   Left Eye Distance:   Bilateral Distance:    Right Eye Near:   Left Eye Near:    Bilateral Near:     Physical Exam  Constitutional: She is oriented to person, place, and time. She appears well-developed and well-nourished. No distress.  Eyes: EOM are normal.  Neck: Normal range of motion. Neck supple.  Cardiovascular: Normal rate, regular rhythm, normal heart sounds and intact distal pulses.  Pulmonary/Chest: Effort normal and breath sounds normal. No respiratory distress.  Abdominal: Soft. Bowel sounds are normal. She exhibits no distension.  Mild tenderness over the left pelvis. No rebound or guarding. Percussion reveals most of the abdomen as tympanic although nondistended.  Musculoskeletal: Normal range of motion. She exhibits no edema.  Tenderness to the left paralumbar musculature. No spinal tenderness, deformity or step-off deformity. No distal weakness. Lower chemistry strength 5 over 5.  Neurological: She is alert and oriented to person, place, and time. She exhibits normal muscle tone. Coordination normal.  Skin: Skin is dry.  Psychiatric: She has a normal mood and affect.  Nursing note and vitals reviewed.    UC Treatments / Results  Labs (all labs ordered are listed, but only abnormal results are displayed) Labs Reviewed  POCT URINALYSIS DIP (DEVICE) - Abnormal; Notable for the following components:      Result Value   Hgb urine dipstick SMALL (*)    Nitrite POSITIVE (*)    All other components within normal limits  POCT PREGNANCY, URINE  POCT I-STAT, CHEM 8  URINE CYTOLOGY ANCILLARY ONLY    EKG  EKG Interpretation None       Radiology No results found.  Procedures Procedures (including critical care time)  Medications Ordered in UC Medications - No data to display   Initial Impression / Assessment and Plan / UC Course  I have reviewed the triage vital signs and the  nursing notes.  Pertinent labs & imaging results that were available during my care of the patient were reviewed by me and considered in my medical decision making (see chart for details).    The bleeding is likely due to the Depo-Provera shot. Follow-up with the health department or the [facility]. You are not anemic at this  time. The sure to drink plenty of fluids. Your back pain is due to muscle strain from recent lifting. It is likely that the pain in the left lower abdomen is due to retained stool and gas. Recommend taking MiraLAX with fluid of choice to help relieve constipation. For worsening go to emergency department. May return as needed. Pain suggest negative. No sign of urinary tract infection.     Final Clinical Impressions(s) / UC Diagnoses   Final diagnoses:  Abnormal uterine bleeding (AUB)  Slow transit constipation  Lumbar strain, initial encounter    ED Discharge Orders    None       Controlled Substance Prescriptions [place] Controlled Substance Registry consulted? Not Applicable   Hayden RasmussenMabe, Elchanan Bob, NP 02/25/17 1351

## 2017-02-25 NOTE — ED Triage Notes (Signed)
Pt states shes been bleeding x2 months, on the depo shot.

## 2017-02-25 NOTE — ED Triage Notes (Signed)
Pt c/o head cold x2 weeks taking OTC medicine, pt c/o nausea/vomiting, stomach pain, pt also c/o L flank pain.

## 2017-02-28 LAB — URINE CYTOLOGY ANCILLARY ONLY
Chlamydia: NEGATIVE
Neisseria Gonorrhea: NEGATIVE
Trichomonas: NEGATIVE

## 2017-05-05 ENCOUNTER — Other Ambulatory Visit: Payer: Self-pay

## 2017-05-05 ENCOUNTER — Ambulatory Visit (HOSPITAL_COMMUNITY)
Admission: EM | Admit: 2017-05-05 | Discharge: 2017-05-05 | Disposition: A | Discharge status: Home / Self Care | Payer: Self-pay | Attending: Family Medicine | Admitting: Family Medicine

## 2017-05-05 ENCOUNTER — Encounter (HOSPITAL_COMMUNITY): Payer: Self-pay | Admitting: Emergency Medicine

## 2017-05-05 DIAGNOSIS — J111 Influenza due to unidentified influenza virus with other respiratory manifestations: Secondary | ICD-10-CM

## 2017-05-05 IMAGING — DX DG NECK SOFT TISSUE
2 series · 2 of 2 positions shown · non-contrast
Comparison: None.

CLINICAL DATA: Throat tightness.  Difficulty swallowing.

EXAM:
NECK SOFT TISSUES - 1+ VIEW

[neck lat]
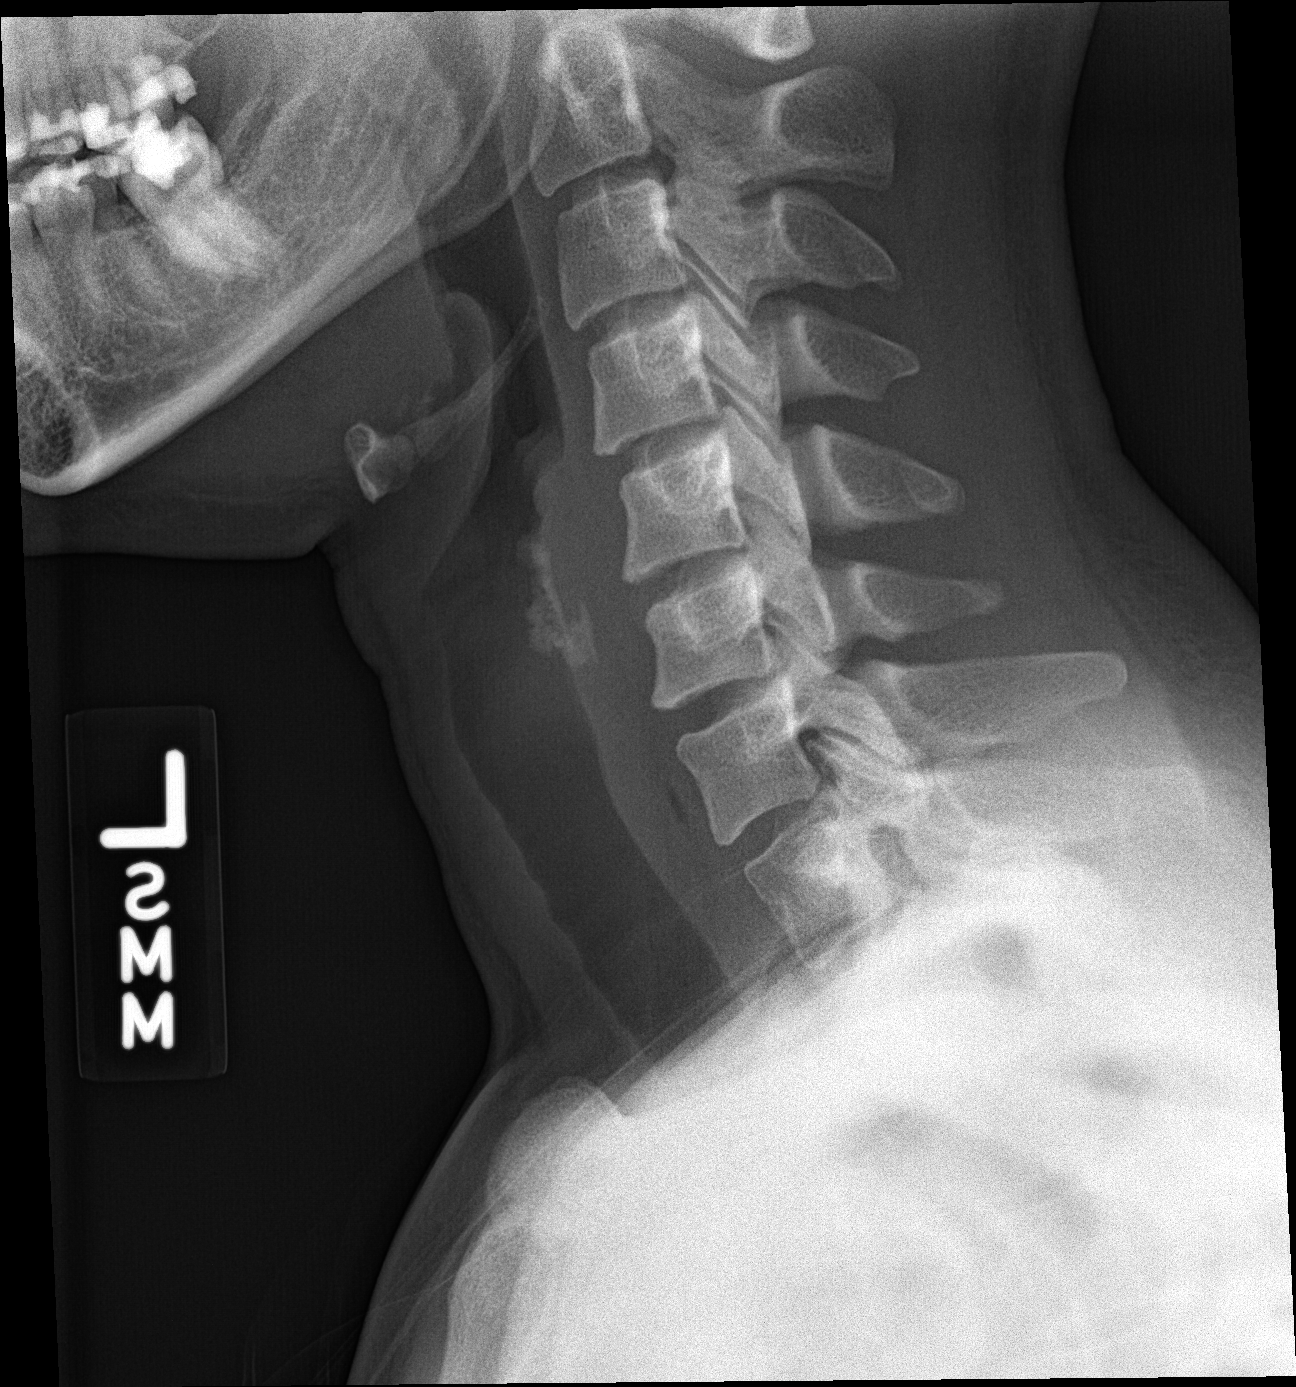

[neck ap]
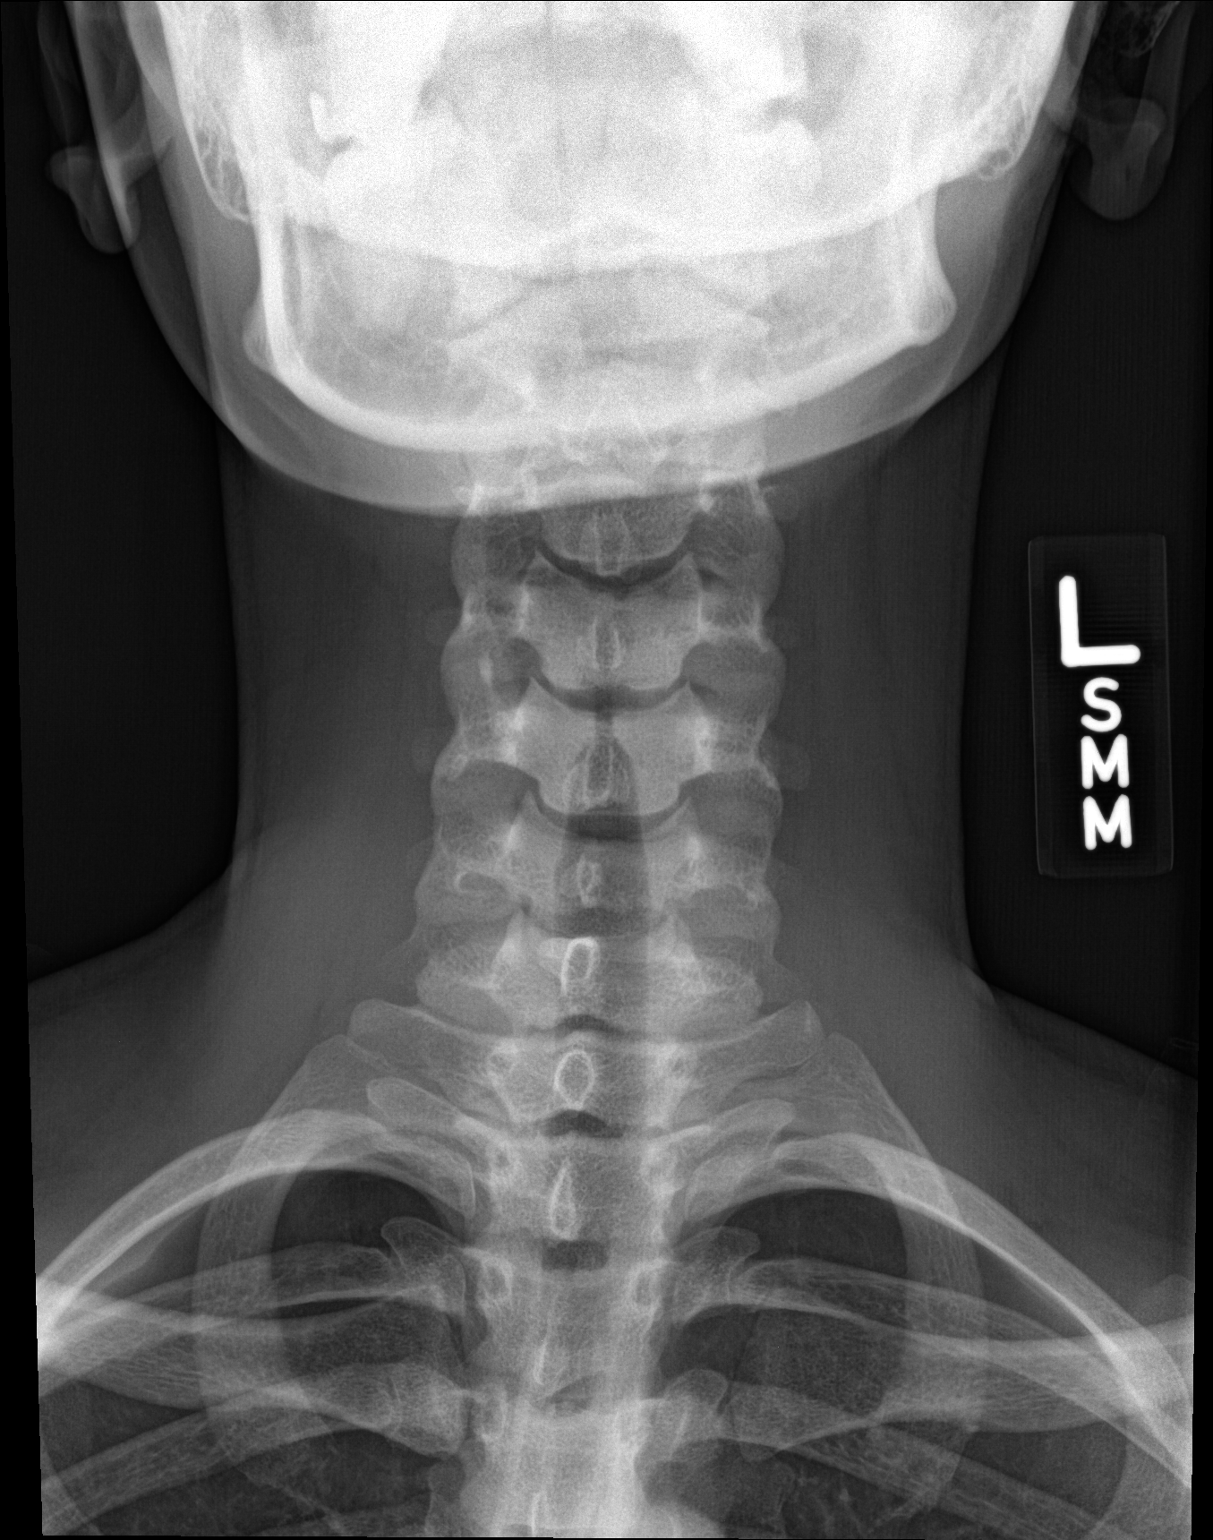

[2 of 2 positions shown; findings below may reference images not displayed]

FINDINGS: There is no evidence of retropharyngeal soft tissue swelling or
epiglottic enlargement. The cervical airway is unremarkable and no
radio-opaque foreign body identified.
IMPRESSION: Negative.

## 2017-05-05 MED ORDER — OSELTAMIVIR PHOSPHATE 75 MG PO CAPS: 75.0000 mg | ORAL_CAPSULE | Freq: Two times a day (BID) | ORAL | 0 refills | Status: DC

## 2017-05-05 MED ORDER — ACETAMINOPHEN 325 MG PO TABS
650.0000 mg | ORAL_TABLET | Freq: Once | ORAL | Status: AC
  Administered 2017-05-05: 650 mg via ORAL

## 2017-05-05 MED ORDER — ACETAMINOPHEN 325 MG PO TABS
ORAL_TABLET | ORAL | Status: AC
  Filled 2017-05-05: qty 2

## 2017-05-05 MED ORDER — ONDANSETRON HCL 4 MG PO TABS: 4.0000 mg | ORAL_TABLET | Freq: Three times a day (TID) | ORAL | 0 refills | Status: DC | PRN

## 2017-05-05 NOTE — ED Triage Notes (Signed)
Pt c/o fever, headaches, body aches since Tuesday.

## 2017-05-05 NOTE — ED Provider Notes (Signed)
[facility]    CSN: [id number] Arrival date & time: 05/05/17  1658     History   Chief Complaint Chief Complaint  Patient presents with  . Fever    HPI Tiffany Ball is a 27 y.o. female.   27 year old female, presenting today complaining of flulike symptoms.  States that she has had subjective fever and chills, generalized body aches, nausea, vomiting and diarrhea that started yesterday.  She denies any known recent sick contacts.  She has not tried anything at home for symptoms.   The history is provided by the patient.  Fever  Severity:  Mild Onset quality:  Gradual Duration:  2 days Timing:  Constant Progression:  Unchanged Chronicity:  New Relieved by:  Nothing Worsened by:  Nothing Ineffective treatments:  None tried Associated symptoms: chills, congestion, cough, diarrhea, myalgias, nausea, sore throat and vomiting   Associated symptoms: no chest pain, no confusion, no dysuria, no ear pain, no headaches, no rash, no rhinorrhea and no somnolence   Risk factors: sick contacts   Risk factors: no contaminated food, no contaminated water and no hx of cancer     Past Medical History:  Diagnosis Date  . Hypercholesteremia   . Vitamin D deficiency     There are no active problems to display for this patient.   Past Surgical History:  Procedure Laterality Date  . knee cyst  2008  . KNEE SURGERY     right  . WISDOM TOOTH EXTRACTION      OB History    Gravida Para Term Preterm AB Living   1 0 0 0 0     SAB TAB Ectopic Multiple Live Births   0 0 0           Home Medications    Prior to Admission medications   Medication Sig Start Date End Date Taking? Authorizing Provider  naproxen (NAPROSYN) 375 MG tablet Take 1 tablet (375 mg total) by mouth once. Patient not taking: Reported on 05/05/2017 07/26/15   Derwood Kaplan, MD  ondansetron (ZOFRAN ODT) 4 MG disintegrating tablet Take 1 tablet (4 mg total) by mouth every 8 (eight) hours as needed  for nausea or vomiting. Patient not taking: Reported on 05/05/2017 04/30/13   Rasch, Victorino Dike I, NP  ondansetron (ZOFRAN ODT) 4 MG disintegrating tablet Take 1 tablet (4 mg total) by mouth every 8 (eight) hours as needed for nausea or vomiting. Patient not taking: Reported on 05/05/2017 04/25/16   Deatra Canter, FNP  ondansetron (ZOFRAN) 4 MG tablet Take 1 tablet (4 mg total) by mouth every 8 (eight) hours as needed for nausea or vomiting. 05/05/17   Koltyn Kelsay C, PA-C  oseltamivir (TAMIFLU) 75 MG capsule Take 1 capsule (75 mg total) by mouth every 12 (twelve) hours. 05/05/17   Balthazar Dooly, Marylene Land, PA-C  Prenatal Vit-Fe Fumarate-FA (PRENATAL MULTIVITAMIN) TABS tablet Take 1 tablet by mouth daily at 12 noon.    [provider]  promethazine (PHENERGAN) 25 MG tablet Take 1 tablet (25 mg total) by mouth every 6 (six) hours as needed for nausea or vomiting. Patient not taking: Reported on 05/05/2017 04/30/13   Rasch, Victorino Dike I, NP  traMADol (ULTRAM) 50 MG tablet Take 1 tablet (50 mg total) by mouth every 6 (six) hours as needed. Patient not taking: Reported on 05/05/2017 10/04/13   Junious Silk, PA-C    Family History No family history on file.  Social History Social History   Tobacco Use  . Smoking  status: Never Smoker  Substance Use Topics  . Alcohol use: Yes    Comment: social  . Drug use: No     Allergies   Plan b [levonorgestrel]   Review of Systems Review of Systems  Constitutional: Positive for chills and fever.  HENT: Positive for congestion and sore throat. Negative for ear pain and rhinorrhea.   Eyes: Negative for pain and visual disturbance.  Respiratory: Positive for cough. Negative for shortness of breath.   Cardiovascular: Negative for chest pain and palpitations.  Gastrointestinal: Positive for diarrhea, nausea and vomiting. Negative for abdominal pain.  Genitourinary: Negative for dysuria and hematuria.  Musculoskeletal: Positive for myalgias. Negative for arthralgias  and back pain.  Skin: Negative for color change and rash.  Neurological: Negative for seizures, syncope and headaches.  Psychiatric/Behavioral: Negative for confusion.  All other systems reviewed and are negative.    Physical Exam Triage Vital Signs ED Triage Vitals [05/05/17 1737]  Enc Vitals Group     BP 112/78     Pulse Rate (!) 120     Resp 20     Temp (!) 101.5 F (38.6 C)     Temp src      SpO2 100 %     Weight      Height      Head Circumference      Peak Flow      Pain Score 8     Pain Loc      Pain Edu?      Excl. in GC?    No data found.  Updated Vital Signs BP 112/78   Pulse (!) 120   Temp (!) 101.5 F (38.6 C)   Resp 20   SpO2 100%   Visual Acuity Right Eye Distance:   Left Eye Distance:   Bilateral Distance:    Right Eye Near:   Left Eye Near:    Bilateral Near:     Physical Exam  Constitutional: She appears well-developed and well-nourished. No distress.  HENT:  Head: Normocephalic and atraumatic.  Right Ear: Hearing, tympanic membrane, external ear and ear canal normal.  Left Ear: Hearing, tympanic membrane, external ear and ear canal normal.  Nose: Nose normal.  Mouth/Throat: Oropharynx is clear and moist. No oropharyngeal exudate, posterior oropharyngeal edema, posterior oropharyngeal erythema or tonsillar abscesses.  Eyes: Conjunctivae are normal.  Neck: Neck supple.  Cardiovascular: Normal rate and regular rhythm.  No murmur heard. Pulmonary/Chest: Effort normal and breath sounds normal. No stridor. No respiratory distress. She has no wheezes. She has no rhonchi. She has no rales.  Abdominal: Soft. There is no tenderness.  Musculoskeletal: She exhibits no edema.  Neurological: She is alert.  Skin: Skin is warm and dry.  Psychiatric: She has a normal mood and affect.  Nursing note and vitals reviewed.    UC Treatments / Results  Labs (all labs ordered are listed, but only abnormal results are displayed) Labs Reviewed - No data  to display  EKG  EKG Interpretation None       Radiology No results found.  Procedures Procedures (including critical care time)  Medications Ordered in UC Medications  acetaminophen (TYLENOL) tablet 650 mg (650 mg Oral Given 05/05/17 1749)     Initial Impression / Assessment and Plan / UC Course  I have reviewed the triage vital signs and the nursing notes.  Pertinent labs & imaging results that were available during my care of the patient were reviewed by me and considered in my medical  decision making (see chart for details).     Flu symptoms Will treat with tamiflu and zofran  Final Clinical Impressions(s) / UC Diagnoses   Final diagnoses:  Influenza    ED Discharge Orders        Ordered    oseltamivir (TAMIFLU) 75 MG capsule  Every 12 hours     05/05/17 1800    ondansetron (ZOFRAN) 4 MG tablet  Every 8 hours PRN     05/05/17 1800       Controlled Substance Prescriptions [place] Controlled Substance Registry consulted? Not Applicable   Alecia LemmingBlue, Shemeka Wardle C, [place] JerseyPA-C 05/05/17 [id number]

## 2018-06-14 ENCOUNTER — Emergency Department (HOSPITAL_COMMUNITY)
Admission: EM | Admit: 2018-06-14 | Discharge: 2018-06-14 | Disposition: A | Discharge status: Home / Self Care | Payer: BLUE CROSS/BLUE SHIELD | Attending: Emergency Medicine | Admitting: Emergency Medicine

## 2018-06-14 ENCOUNTER — Emergency Department (HOSPITAL_COMMUNITY): Payer: BLUE CROSS/BLUE SHIELD

## 2018-06-14 ENCOUNTER — Other Ambulatory Visit: Payer: Self-pay

## 2018-06-14 ENCOUNTER — Encounter (HOSPITAL_COMMUNITY): Payer: Self-pay

## 2018-06-14 DIAGNOSIS — Z79899 Other long term (current) drug therapy: Secondary | ICD-10-CM | POA: Insufficient documentation

## 2018-06-14 DIAGNOSIS — R509 Fever, unspecified: Secondary | ICD-10-CM | POA: Insufficient documentation

## 2018-06-14 DIAGNOSIS — K5900 Constipation, unspecified: Secondary | ICD-10-CM | POA: Diagnosis not present

## 2018-06-14 DIAGNOSIS — R112 Nausea with vomiting, unspecified: Secondary | ICD-10-CM | POA: Insufficient documentation

## 2018-06-14 DIAGNOSIS — R1030 Lower abdominal pain, unspecified: Secondary | ICD-10-CM | POA: Diagnosis present

## 2018-06-14 DIAGNOSIS — R109 Unspecified abdominal pain: Secondary | ICD-10-CM

## 2018-06-14 LAB — COMPREHENSIVE METABOLIC PANEL
ALT: 10 U/L (ref 0–44)
AST: 21 U/L (ref 15–41)
Albumin: 4.6 g/dL (ref 3.5–5.0)
Alkaline Phosphatase: 67 U/L (ref 38–126)
Anion gap: 9 (ref 5–15)
BILIRUBIN TOTAL: 1.2 mg/dL (ref 0.3–1.2)
BUN: 11 mg/dL (ref 6–20)
CHLORIDE: 105 mmol/L (ref 98–111)
CO2: 27 mmol/L (ref 22–32)
Calcium: 9.7 mg/dL (ref 8.9–10.3)
Creatinine, Ser: 0.84 mg/dL (ref 0.44–1.00)
Glucose, Bld: 89 mg/dL (ref 70–99)
POTASSIUM: 3.8 mmol/L (ref 3.5–5.1)
Sodium: 141 mmol/L (ref 135–145)
TOTAL PROTEIN: 8.7 g/dL — AB (ref 6.5–8.1)

## 2018-06-14 LAB — CBC
HCT: 41.4 % (ref 36.0–46.0)
HEMOGLOBIN: 13.1 g/dL (ref 12.0–15.0)
MCH: 27.9 pg (ref 26.0–34.0)
MCHC: 31.6 g/dL (ref 30.0–36.0)
MCV: 88.1 fL (ref 80.0–100.0)
NRBC: 0 % (ref 0.0–0.2)
Platelets: 251 10*3/uL (ref 150–400)
RBC: 4.7 MIL/uL (ref 3.87–5.11)
RDW: 11.7 % (ref 11.5–15.5)
WBC: 5.5 10*3/uL (ref 4.0–10.5)

## 2018-06-14 LAB — URINALYSIS, ROUTINE W REFLEX MICROSCOPIC
Bacteria, UA: NONE SEEN
Bilirubin Urine: NEGATIVE
Glucose, UA: NEGATIVE mg/dL
HGB URINE DIPSTICK: NEGATIVE
Ketones, ur: NEGATIVE mg/dL
Leukocytes,Ua: NEGATIVE
NITRITE: NEGATIVE
Protein, ur: 30 mg/dL — AB
SPECIFIC GRAVITY, URINE: 1.005 (ref 1.005–1.030)
pH: 6 (ref 5.0–8.0)

## 2018-06-14 LAB — I-STAT BETA HCG BLOOD, ED (MC, WL, AP ONLY)

## 2018-06-14 LAB — ABO/RH: ABO/RH(D): A POS

## 2018-06-14 LAB — LIPASE, BLOOD: Lipase: 27 U/L (ref 11–51)

## 2018-06-14 LAB — TYPE AND SCREEN
ABO/RH(D): A POS
ANTIBODY SCREEN: NEGATIVE

## 2018-06-14 LAB — POC OCCULT BLOOD, ED: Fecal Occult Bld: NEGATIVE

## 2018-06-14 MED ORDER — DOCUSATE SODIUM 250 MG PO CAPS: 250.0000 mg | ORAL_CAPSULE | Freq: Every day | ORAL | 0 refills | Status: DC

## 2018-06-14 MED ORDER — SODIUM CHLORIDE 0.9 % IV BOLUS
1000.0000 mL | Freq: Once | INTRAVENOUS | Status: AC
  Administered 2018-06-14: 1000 mL via INTRAVENOUS

## 2018-06-14 MED ORDER — ACETAMINOPHEN 325 MG PO TABS
650.0000 mg | ORAL_TABLET | Freq: Once | ORAL | Status: AC
  Administered 2018-06-14: 650 mg via ORAL
  Filled 2018-06-14: qty 2

## 2018-06-14 MED ORDER — MORPHINE SULFATE (PF) 4 MG/ML IV SOLN
4.0000 mg | Freq: Once | INTRAVENOUS | Status: AC
  Administered 2018-06-14: 4 mg via INTRAVENOUS
  Filled 2018-06-14: qty 1

## 2018-06-14 MED ORDER — ONDANSETRON HCL 4 MG/2ML IJ SOLN
4.0000 mg | Freq: Once | INTRAMUSCULAR | Status: AC
  Administered 2018-06-14: 4 mg via INTRAVENOUS
  Filled 2018-06-14: qty 2

## 2018-06-14 NOTE — ED Triage Notes (Signed)
Patient d/o N/v, black stools, and lower abdominal pain x 2 days.

## 2018-06-14 NOTE — Discharge Instructions (Addendum)
° ° °  You have been seen today for abdominal pain. Please read and follow all provided instructions. Return to the emergency room for worsening condition or new concerning symptoms.    1. Medications:  Please take Miralax for constipation. This is an over the counter medication you can take for constipation. Please take it daily for the next week to  help with your constipation. I have also sent a prescription for Colace to your pharmacy. This is a stool softener. Please take as prescribed.  Continue usual home medications. Take medications as prescribed. Please review all of the medicines and only take them if you do not have an allergy to them.   2. Treatment: rest, drink plenty of fluids. I have included information about constipation in your discharge paperwork. It includes a list of foods that are high in fiber, you should try to include them in your diet.  3. Follow Up: Please follow up with your primary doctor in 2-5 days for discussion of your diagnoses and further evaluation after today's visit; Call today to arrange your follow up.  If you do not have a primary care doctor use the resource guide provided to find one;   It is also a possibility that you have an allergic reaction to any of the medicines that you have been prescribed - Everybody reacts differently to medications and while MOST people have no trouble with most medicines, you may have a reaction such as nausea, vomiting, rash, swelling, shortness of breath. If this is the case, please stop taking the medicine immediately and contact your physician.  ?

## 2018-06-14 NOTE — ED Provider Notes (Signed)
[facility]-EMERGENCY DEPT Provider Note   CSN: [id number] Arrival date & time: 06/14/18  1209    History   Chief Complaint Chief Complaint  Patient presents with  . Emesis  . Abdominal Pain  . dark stools    HPI Tiffany Ball is a 28 y.o. female with history of hyper lipidemia, vitamin C deficiency presenting to emergency department today with chief complaint of lower abdominal pain x2 days.  Patient states that pain has been constant.  She states the pain for started around her umbilicus and then moved to right and left lower quadrants.  She describes the pain as sharp and 10/10 in severity.  Patient has associated nausea and vomiting.  She describes the vomiting as 3 episodes of nonbloody and bilious emesis.  Patient has been taking Pepto-Bismol.  After taking the Pepto-Bismol she noticed soft black stool.  Patient states prior to abdominal pain starting she has been constipated for last 1 week.  Patient reports subjective fevers but did not check her temperature at home.  Patient denies abdominal surgical history. Denies back pain, pelvic pain, vaginal discharge, hematuria, flank pain, chest pain. History provided by pt.  Past Medical History:  Diagnosis Date  . Hypercholesteremia   . Vitamin D deficiency     There are no active problems to display for this patient.   Past Surgical History:  Procedure Laterality Date  . knee cyst  2008  . KNEE SURGERY     right  . WISDOM TOOTH EXTRACTION       OB History    Gravida  1   Para  0   Term  0   Preterm  0   AB  0   Living        SAB  0   TAB  0   Ectopic  0   Multiple      Live Births               Home Medications    Prior to Admission medications   Medication Sig Start Date End Date Taking? Authorizing Provider  bismuth subsalicylate (PEPTO BISMOL) 262 MG/15ML suspension Take 30 mLs by mouth every 6 (six) hours as needed for indigestion.   Yes [provider]   docusate sodium (COLACE) 250 MG capsule Take 1 capsule (250 mg total) by mouth daily. 06/14/18   Antanisha Mohs E, PA-C  naproxen (NAPROSYN) 375 MG tablet Take 1 tablet (375 mg total) by mouth once. Patient not taking: Reported on 05/05/2017 07/26/15   Derwood Kaplan, MD  ondansetron (ZOFRAN ODT) 4 MG disintegrating tablet Take 1 tablet (4 mg total) by mouth every 8 (eight) hours as needed for nausea or vomiting. Patient not taking: Reported on 05/05/2017 04/30/13   Rasch, Victorino Dike I, NP  ondansetron (ZOFRAN ODT) 4 MG disintegrating tablet Take 1 tablet (4 mg total) by mouth every 8 (eight) hours as needed for nausea or vomiting. Patient not taking: Reported on 05/05/2017 04/25/16   Deatra Canter, FNP  ondansetron (ZOFRAN) 4 MG tablet Take 1 tablet (4 mg total) by mouth every 8 (eight) hours as needed for nausea or vomiting. Patient not taking: Reported on 06/14/2018 05/05/17   Alecia Lemming, PA-C  oseltamivir (TAMIFLU) 75 MG capsule Take 1 capsule (75 mg total) by mouth every 12 (twelve) hours. Patient not taking: Reported on 06/14/2018 05/05/17   Alecia Lemming, PA-C  Prenatal Vit-Fe Fumarate-FA (PRENATAL MULTIVITAMIN) TABS tablet Take 1 tablet by mouth daily  at 12 noon.    [provider]  promethazine (PHENERGAN) 25 MG tablet Take 1 tablet (25 mg total) by mouth every 6 (six) hours as needed for nausea or vomiting. Patient not taking: Reported on 05/05/2017 04/30/13   Rasch, Victorino Dike I, NP  traMADol (ULTRAM) 50 MG tablet Take 1 tablet (50 mg total) by mouth every 6 (six) hours as needed. Patient not taking: Reported on 05/05/2017 10/04/13   Junious Silk, PA-C    Family History History reviewed. No pertinent family history.  Social History Social History   Tobacco Use  . Smoking status: Never Smoker  . Smokeless tobacco: Never Used  Substance Use Topics  . Alcohol use: Yes    Comment: social  . Drug use: No     Allergies   Plan b [levonorgestrel]   Review of Systems Review of  Systems  Constitutional: Positive for fever. Negative for chills.  HENT: Negative for congestion, ear discharge, ear pain, sinus pressure, sinus pain and sore throat.   Eyes: Negative for pain and redness.  Respiratory: Negative for cough and shortness of breath.   Cardiovascular: Negative for chest pain.  Gastrointestinal: Positive for abdominal pain, constipation, nausea and vomiting. Negative for abdominal distention, diarrhea and rectal pain.  Genitourinary: Negative for dysuria, flank pain, hematuria, pelvic pain, vaginal bleeding and vaginal discharge.  Musculoskeletal: Negative for back pain and neck pain.  Skin: Negative for wound.  Neurological: Negative for weakness, numbness and headaches.     Physical Exam Updated Vital Signs BP 122/81 (BP Location: Left Arm)   Pulse 93   Temp 98.1 F (36.7 C) (Oral)   Resp 14   Ht  (1.549 m)   Wt 47.2 kg   SpO2 100%   BMI 19.65 kg/m   Physical Exam Vitals signs and nursing note reviewed.  Constitutional:      Appearance: She is well-developed. She is not toxic-appearing.     Comments: Patient appears uncomfortable secondary to pain.  She is curled up laying in the fetal position.  Patient is in no acute distress.  HENT:     Head: Normocephalic and atraumatic.  Eyes:     General: No scleral icterus.       Right eye: No discharge.        Left eye: No discharge.     Conjunctiva/sclera: Conjunctivae normal.  Neck:     Musculoskeletal: Normal range of motion.  Cardiovascular:     Rate and Rhythm: Normal rate and regular rhythm.     Pulses: Normal pulses.     Heart sounds: Normal heart sounds.  Pulmonary:     Effort: Pulmonary effort is normal.     Breath sounds: Normal breath sounds.  Abdominal:     General: There is no distension.     Tenderness: There is abdominal tenderness in the right lower quadrant and left lower quadrant. There is guarding. There is no right CVA tenderness or left CVA tenderness. Negative signs  include McBurney's sign.  Genitourinary:    Comments: Chaperone present for exam RN Mandy. Digital Rectal Exam reveals sphincter with good tone. No external hemorrhoids. No masses or fissures. Stool color is brown with no overt blood. No gross melena.  Musculoskeletal: Normal range of motion.  Skin:    General: Skin is warm and dry.  Neurological:     Mental Status: She is oriented to person, place, and time.     Comments: Fluent speech, no facial droop.  Psychiatric:  Behavior: Behavior normal.      ED Treatments / Results  Labs (all labs ordered are listed, but only abnormal results are displayed) Labs Reviewed  COMPREHENSIVE METABOLIC PANEL - Abnormal; Notable for the following components:      Result Value   Total Protein 8.7 (*)    All other components within normal limits  URINALYSIS, ROUTINE W REFLEX MICROSCOPIC - Abnormal; Notable for the following components:   Color, Urine STRAW (*)    Protein, ur 30 (*)    All other components within normal limits  CBC  LIPASE, BLOOD  POC OCCULT BLOOD, ED  I-STAT BETA HCG BLOOD, ED ([facility], [facility], [facility] ONLY)  TYPE AND SCREEN  ABO/RH    EKG None  Radiology Dg Abd Acute W/chest  Result Date: 06/14/2018 CLINICAL DATA:  Abdominal pain with nausea and vomiting EXAM: DG ABDOMEN ACUTE W/ 1V CHEST COMPARISON:  None. FINDINGS: PA chest: No edema or consolidation. Heart size and pulmonary vascularity are normal. No adenopathy. Supine and upright abdomen: There is moderate stool in the colon. There is no bowel dilatation or air-fluid level to suggest bowel obstruction. No free air. No abnormal calcification. Body piercings noted. IMPRESSION: No bowel obstruction or free air.  No lung edema or consolidation. Electronically Signed   By: Bretta Bang III M.D.   On: 06/14/2018 15:47    Procedures Procedures (including critical care time)  Medications Ordered in ED Medications  acetaminophen (TYLENOL) tablet 650 mg (has no  administration in time range)  morphine 4 MG/ML injection 4 mg (4 mg Intravenous Given 06/14/18 1432)  ondansetron (ZOFRAN) injection 4 mg (4 mg Intravenous Given 06/14/18 1432)  sodium chloride 0.9 % bolus 1,000 mL (0 mLs Intravenous Stopped 06/14/18 1728)     Initial Impression / Assessment and Plan / ED Course  I have reviewed the triage vital signs and the nursing notes.  Pertinent labs & imaging results that were available during my care of the patient were reviewed by me and considered in my medical decision making (see chart for details).    DDX includes gastritis, PID, UTI,  less likely appendicitis, ectopic, ovarian torsion, tubo-ovarian abscess, GI bleed. The pt reports when moving the stretcher for her exam it improved her pain, making appendicitis less likely. Also the location of the pain being in bilateral lower quadrants is not suggestive of appendicitis or ovarian torsion. Pt's black stool is after taking peptobismal, again making GI bleed less likely. Will initiate work up with labs including CBC, CMP, UA, urine pregnancy, and lipase, chest xray. Will treat symptoms with IVF fluids, Zofran, and morphine. Will reassess. I personally viewed abdomen and chest xray which shows moderate stool in the colon. There is no free air, bowel dilation or air fluid levels, I agree with the radiologist.  On repeat exam pt states her pain has improved. Her abdomen is soft,  benign. She is tolerating PO fluids and food while in the department. Pt's labs are unremarkable. UA does not show signs of infection. Will discharge home with colace and recommend OTC treatments for constipation.  Patient is hemodynamically stable, in NAD, and able to ambulate in the ED. Evaluation does not show pathology that would require ongoing emergent intervention or inpatient treatment. I explained the diagnosis to the patient. Pain has been managed and has no complaints prior to discharge. Patient is comfortable with above  plan and is stable for discharge at this time. All questions were answered prior to disposition. Strict return precautions for  returning to the ED were discussed. Encouraged follow up with PCP. Pt care discussed with Dr. Patria Mane who agrees with my plan.    This note was prepared with assistance of Conservation officer, historic buildings. Occasional wrong-word or sound-a-like substitutions may have occurred due to the inherent limitations of voice recognition software.    Final Clinical Impressions(s) / ED Diagnoses   Final diagnoses:  Abdominal pain, unspecified abdominal location  Constipation, unspecified constipation type    ED Discharge Orders         Ordered    docusate sodium (COLACE) 250 MG capsule  Daily     06/14/18 1624           Kathyrn Lass 06/16/18 8563    Azalia Bilis, MD 06/17/18 1513

## 2018-11-02 ENCOUNTER — Other Ambulatory Visit: Payer: Self-pay

## 2018-11-02 DIAGNOSIS — Z20822 Contact with and (suspected) exposure to covid-19: Secondary | ICD-10-CM

## 2018-11-04 LAB — SPECIMEN STATUS REPORT

## 2018-11-04 LAB — NOVEL CORONAVIRUS, NAA: SARS-CoV-2, NAA: NOT DETECTED

## 2018-12-24 ENCOUNTER — Encounter (HOSPITAL_COMMUNITY): Payer: Self-pay

## 2018-12-24 ENCOUNTER — Other Ambulatory Visit: Payer: Self-pay

## 2018-12-24 ENCOUNTER — Ambulatory Visit (HOSPITAL_COMMUNITY)
Admission: EM | Admit: 2018-12-24 | Discharge: 2018-12-24 | Disposition: A | Discharge status: Home / Self Care | Payer: BC Managed Care – PPO | Attending: Urgent Care | Admitting: Urgent Care

## 2018-12-24 DIAGNOSIS — Z20828 Contact with and (suspected) exposure to other viral communicable diseases: Secondary | ICD-10-CM | POA: Insufficient documentation

## 2018-12-24 DIAGNOSIS — R5381 Other malaise: Secondary | ICD-10-CM | POA: Diagnosis present

## 2018-12-24 DIAGNOSIS — Z566 Other physical and mental strain related to work: Secondary | ICD-10-CM | POA: Diagnosis present

## 2018-12-24 DIAGNOSIS — R0789 Other chest pain: Secondary | ICD-10-CM | POA: Insufficient documentation

## 2018-12-24 DIAGNOSIS — Z888 Allergy status to other drugs, medicaments and biological substances status: Secondary | ICD-10-CM | POA: Insufficient documentation

## 2018-12-24 DIAGNOSIS — R5383 Other fatigue: Secondary | ICD-10-CM | POA: Diagnosis not present

## 2018-12-24 DIAGNOSIS — Z79899 Other long term (current) drug therapy: Secondary | ICD-10-CM | POA: Diagnosis not present

## 2018-12-24 MED ORDER — ALBUTEROL SULFATE HFA 108 (90 BASE) MCG/ACT IN AERS: 1.0000 | INHALATION_SPRAY | Freq: Four times a day (QID) | RESPIRATORY_TRACT | 0 refills | Status: DC | PRN

## 2018-12-24 MED ORDER — BENZONATATE 100 MG PO CAPS: 100.0000 mg | ORAL_CAPSULE | Freq: Three times a day (TID) | ORAL | 0 refills | Status: DC | PRN

## 2018-12-24 MED ORDER — NAPROXEN 500 MG PO TABS: 500.0000 mg | ORAL_TABLET | Freq: Two times a day (BID) | ORAL | 0 refills | Status: DC

## 2018-12-24 MED ORDER — PROMETHAZINE-DM 6.25-15 MG/5ML PO SYRP: 5.0000 mL | ORAL_SOLUTION | Freq: Every evening | ORAL | 0 refills | Status: DC | PRN

## 2018-12-24 NOTE — Discharge Instructions (Signed)

## 2018-12-24 NOTE — ED Triage Notes (Signed)
Pt present chest pain on the left side, symptoms started a week ago.pt states that when she takes deep breaths her chest gets tight

## 2018-12-24 NOTE — ED Provider Notes (Signed)
MRN: [id number] DOB: Aug 03, 1990  Subjective:   Tiffany Ball is a 28 y.o. female presenting for 1 week history of persistent intermittent left sided upper chest pain/tightness, worse with taking a deep breath with mild occasional wheezing. 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  Had negative COVID 19 test 11/02/2018.  No current facility-administered medications for this encounter.   Current Outpatient Medications:  .  bismuth subsalicylate (PEPTO BISMOL) 262 MG/15ML suspension, Take 30 mLs by mouth every 6 (six) hours as needed for indigestion., Disp: , Rfl:  .  docusate sodium (COLACE) 250 MG capsule, Take 1 capsule (250 mg total) by mouth daily., Disp: 10 capsule, Rfl: 0 .  naproxen (NAPROSYN) 375 MG tablet, Take 1 tablet (375 mg total) by mouth once. (Patient not taking: Reported on 05/05/2017), Disp: 20 tablet, Rfl: 0 .  ondansetron (ZOFRAN ODT) 4 MG disintegrating tablet, Take 1 tablet (4 mg total) by mouth every 8 (eight) hours as needed for nausea or vomiting. (Patient not taking: Reported on 05/05/2017), Disp: 20 tablet, Rfl: 0 .  ondansetron (ZOFRAN ODT) 4 MG disintegrating tablet, Take 1 tablet (4 mg total) by mouth every 8 (eight) hours as needed for nausea or vomiting. (Patient not taking: Reported on 05/05/2017), Disp: 20 tablet, Rfl: 0 .  ondansetron (ZOFRAN) 4 MG tablet, Take 1 tablet (4 mg total) by mouth every 8 (eight) hours as needed for nausea or vomiting. (Patient not taking: Reported on 06/14/2018), Disp: 20 tablet, Rfl: 0 .  oseltamivir (TAMIFLU) 75 MG capsule, Take 1 capsule (75 mg total) by mouth every 12 (twelve) hours. (Patient not taking: Reported on 06/14/2018), Disp: 10 capsule, Rfl: 0 .  Prenatal Vit-Fe Fumarate-FA (PRENATAL MULTIVITAMIN) TABS tablet, Take 1 tablet by mouth daily at 12 noon., Disp: , Rfl:  .   promethazine (PHENERGAN) 25 MG tablet, Take 1 tablet (25 mg total) by mouth every 6 (six) hours as needed for nausea or vomiting. (Patient not taking: Reported on 05/05/2017), Disp: 30 tablet, Rfl: 0 .  traMADol (ULTRAM) 50 MG tablet, Take 1 tablet (50 mg total) by mouth every 6 (six) hours as needed. (Patient not taking: Reported on 05/05/2017), Disp: 10 tablet, Rfl: 0   Allergies  Allergen Reactions  . Plan B [Levonorgestrel] Anaphylaxis    Was eating peanuts when took this so not sure if it was plan b or peanuts.    Past Medical History:  Diagnosis Date  . Hypercholesteremia   . Vitamin D deficiency      Past Surgical History:  Procedure Laterality Date  . knee cyst  2008  . KNEE SURGERY     right  . WISDOM TOOTH EXTRACTION      Review of Systems  Constitutional: Negative for fever and malaise/fatigue.  HENT: Positive for sore throat. Negative for congestion, ear pain and sinus pain.   Eyes: Negative for blurred vision, double vision, discharge and redness.  Respiratory: Positive for shortness of breath. Negative for cough, hemoptysis and wheezing.   Cardiovascular: Positive for chest pain.  Gastrointestinal: Negative for abdominal pain, diarrhea, nausea and vomiting.  Genitourinary: Negative for dysuria, flank pain and hematuria.  Musculoskeletal: Negative for myalgias.  Skin: Negative for rash.  Neurological: Positive for headaches. Negative for dizziness and weakness.  Psychiatric/Behavioral: Negative for depression and substance abuse.    Objective:   Vitals: BP 119/76 (BP Location: Left Arm)  Pulse 94   Temp 98 F (36.7 C) (Oral)   Resp 16   SpO2 100%   Physical Exam Constitutional:      General: She is not in acute distress.    Appearance: Normal appearance. She is well-developed. She is not ill-appearing, toxic-appearing or diaphoretic.  HENT:     Head: Normocephalic and atraumatic.     Right Ear: Tympanic membrane and ear canal normal. No drainage or  tenderness. No middle ear effusion. Tympanic membrane is not erythematous.     Left Ear: Tympanic membrane and ear canal normal. No drainage or tenderness.  No middle ear effusion. Tympanic membrane is not erythematous.     Nose: Nose normal. No congestion or rhinorrhea.     Mouth/Throat:     Mouth: Mucous membranes are moist. No oral lesions.     Pharynx: Oropharynx is clear. No pharyngeal swelling, oropharyngeal exudate, posterior oropharyngeal erythema or uvula swelling.     Tonsils: No tonsillar exudate or tonsillar abscesses.  Eyes:     Extraocular Movements: Extraocular movements intact.     Right eye: Normal extraocular motion.     Left eye: Normal extraocular motion.     Conjunctiva/sclera: Conjunctivae normal.     Pupils: Pupils are equal, round, and reactive to light.  Neck:     Musculoskeletal: Normal range of motion and neck supple.  Cardiovascular:     Rate and Rhythm: Normal rate and regular rhythm.     Pulses: Normal pulses.     Heart sounds: Normal heart sounds. No murmur. No friction rub. No gallop.   Pulmonary:     Effort: Pulmonary effort is normal. No respiratory distress.     Breath sounds: Normal breath sounds. No stridor. No wheezing, rhonchi or rales.  Chest:    Lymphadenopathy:     Cervical: No cervical adenopathy.  Skin:    General: Skin is warm and dry.     Findings: No rash.  Neurological:     General: No focal deficit present.     Mental Status: She is alert and oriented to person, place, and time.  Psychiatric:        Mood and Affect: Mood normal.        Behavior: Behavior normal.        Thought Content: Thought content normal.      Assessment and Plan :   1. Atypical chest pain   2. Chest tightness   3. Stress at work   4. Malaise and fatigue     Will manage for viral illness, chest strain related to the nature of her work versus stress versus common cold, COVID-19 testing pending. Counseled patient on nature of COVID-19 including modes  of transmission, diagnostic testing, management and supportive care.  Offered symptomatic relief.  Patient requested albuterol inhaler given that her mother has asthma and states that using her mother's inhaler helped her chest tightness.  Due to reassuring vital signs, normal lung sounds will hold off on chest x-ray.  Counseled patient on potential for adverse effects with medications prescribed/recommended today, ER and return-to-clinic precautions discussed, patient verbalized understanding.     Jaynee Eagles, [place] 12/24/18 7001

## 2018-12-26 LAB — NOVEL CORONAVIRUS, NAA (HOSP ORDER, SEND-OUT TO REF LAB; TAT 18-24 HRS): SARS-CoV-2, NAA: NOT DETECTED

## 2019-02-13 ENCOUNTER — Other Ambulatory Visit: Payer: Self-pay

## 2019-02-13 DIAGNOSIS — Z20822 Contact with and (suspected) exposure to covid-19: Secondary | ICD-10-CM

## 2019-02-15 LAB — NOVEL CORONAVIRUS, NAA: SARS-CoV-2, NAA: NOT DETECTED

## 2019-04-04 ENCOUNTER — Other Ambulatory Visit: Payer: Self-pay

## 2019-04-04 DIAGNOSIS — Z20822 Contact with and (suspected) exposure to covid-19: Secondary | ICD-10-CM

## 2019-04-05 LAB — NOVEL CORONAVIRUS, NAA: SARS-CoV-2, NAA: NOT DETECTED

## 2019-10-24 ENCOUNTER — Ambulatory Visit: Payer: BC Managed Care – PPO | Attending: Internal Medicine

## 2019-10-24 DIAGNOSIS — Z20822 Contact with and (suspected) exposure to covid-19: Secondary | ICD-10-CM

## 2019-10-25 LAB — SARS-COV-2, NAA 2 DAY TAT

## 2019-10-25 LAB — NOVEL CORONAVIRUS, NAA: SARS-CoV-2, NAA: NOT DETECTED

## 2020-02-04 ENCOUNTER — Encounter: Payer: Self-pay | Admitting: Nurse Practitioner

## 2020-02-04 ENCOUNTER — Other Ambulatory Visit: Payer: Self-pay

## 2020-02-04 ENCOUNTER — Ambulatory Visit: Payer: BC Managed Care – PPO | Admitting: Nurse Practitioner

## 2020-02-04 VITALS — BP 112/80 | HR 95 | Temp 99.3°F | Ht 61.8 in | Wt 104.6 lb

## 2020-02-04 DIAGNOSIS — R112 Nausea with vomiting, unspecified: Secondary | ICD-10-CM | POA: Diagnosis not present

## 2020-02-04 DIAGNOSIS — R109 Unspecified abdominal pain: Secondary | ICD-10-CM | POA: Diagnosis not present

## 2020-02-04 DIAGNOSIS — N926 Irregular menstruation, unspecified: Secondary | ICD-10-CM | POA: Diagnosis not present

## 2020-02-04 DIAGNOSIS — J069 Acute upper respiratory infection, unspecified: Secondary | ICD-10-CM | POA: Diagnosis not present

## 2020-02-04 LAB — POCT URINALYSIS DIPSTICK
Bilirubin, UA: NEGATIVE
Blood, UA: NEGATIVE
Glucose, UA: NEGATIVE
Ketones, UA: NEGATIVE
Leukocytes, UA: NEGATIVE
Nitrite, UA: NEGATIVE
Protein, UA: NEGATIVE
Spec Grav, UA: 1.02 (ref 1.010–1.025)
Urobilinogen, UA: 1 E.U./dL
pH, UA: 7 (ref 5.0–8.0)

## 2020-02-04 LAB — POCT URINE PREGNANCY: Preg Test, Ur: NEGATIVE

## 2020-02-04 MED ORDER — AZITHROMYCIN 250 MG PO TABS: ORAL_TABLET | ORAL | 0 refills | Status: AC

## 2020-02-04 MED ORDER — PROMETHAZINE HCL 12.5 MG PO TABS: 12.5000 mg | ORAL_TABLET | Freq: Four times a day (QID) | ORAL | 0 refills | Status: DC | PRN

## 2020-02-04 NOTE — Progress Notes (Signed)
I,Yamilka Roman [employer] as a Education administrator for [employer], FNP.,have documented all relevant documentation on the behalf of Minette Brine, FNP,as directed by  Minette Brine, FNP while in the presence of Minette Brine, [employer]. This visit occurred during the SARS-CoV-2 public health emergency.  Safety protocols were in place, including screening questions prior to the visit, additional usage of staff PPE, and extensive cleaning of exam room while observing appropriate contact time as indicated for disinfecting solutions.  Subjective:     Patient ID: Tiffany Ball , female    DOB: 04-Jun-1990 , 28 y.o.   MRN: [id number]   Chief Complaint  Patient presents with  . URI    patient stated she has been having cold symptoms for the past 2 weeks. she has chills,fever, bodyaches,weak     HPI  She is here to for the first time and was referred by her mother Launa Grill.  Here with cold symptoms for the last 3 weeks.  She has had nausea and vomiting off and on for the last 3 weeks. She was tested for covid one week after having her symptoms and was negative.  She has been having a low grade fever off and on.  She has not been taking any medication. She is also having right flank pain intermittently.  She has not had a menstrual cycle since June since stopping her Depo.    She is also reporting having a bowel movement every 2-3 days.  Reports she does drink adequate amounts of water but eats little fruits and vegetables.     Past Medical History:  Diagnosis Date  . Hypercholesteremia   . Vitamin D deficiency      History reviewed. No pertinent family history.   Current Outpatient Medications:  .  azithromycin (ZITHROMAX) 250 MG tablet, Take 2 tablets (500 mg) on  Day 1,  followed by 1 tablet (250 mg) once daily on Days 2 through 5., Disp: 6 each, Rfl: 0 .  promethazine (PHENERGAN) 12.5 MG tablet, Take 1 tablet (12.5 mg total) by mouth every 6 (six) hours as needed for nausea or vomiting., Disp: 30  tablet, Rfl: 0   Allergies  Allergen Reactions  . Plan B [Levonorgestrel] Anaphylaxis    Was eating peanuts when took this so not sure if it was plan b or peanuts.     Review of Systems  Constitutional: Negative.   HENT: Positive for congestion, rhinorrhea, sinus pressure and sinus pain. Negative for sneezing and sore throat.   Respiratory: Negative.   Cardiovascular: Negative.  Negative for chest pain, palpitations and leg swelling.  Genitourinary: Positive for flank pain (right ).  Neurological: Negative for dizziness and headaches.  Psychiatric/Behavioral: Negative.      Today's Vitals   02/04/20 1543  BP: 112/80  Pulse: 95  Temp: 99.3 F (37.4 C)  Weight: 104 lb 9.6 oz (47.4 kg)  Height: 5' 1.8" (1.57 m)  PainSc: 10-Worst pain ever  PainLoc: Back   Body mass index is 19.26 kg/m.   Objective:  Physical Exam Vitals reviewed.  Constitutional:      General: She is not in acute distress.    Appearance: Normal appearance.  HENT:     Right Ear: Tympanic membrane, ear canal and external ear normal. There is no impacted cerumen.     Left Ear: Tympanic membrane, ear canal and external ear normal. There is no impacted cerumen.     Nose:     Comments: Nasal congestion Cardiovascular:  Rate and Rhythm: Normal rate and regular rhythm.     Pulses: Normal pulses.     Heart sounds: Normal heart sounds. No murmur heard.   Pulmonary:     Effort: Pulmonary effort is normal. No respiratory distress.     Breath sounds: Normal breath sounds. No wheezing.  Musculoskeletal:        General: Tenderness (right flank pain) present.  Skin:    Capillary Refill: Capillary refill takes less than 2 seconds.  Neurological:     General: No focal deficit present.     Mental Status: She is alert and oriented to person, place, and time.     Cranial Nerves: No cranial nerve deficit.  Psychiatric:        Mood and Affect: Mood normal.        Behavior: Behavior normal.        Thought  Content: Thought content normal.        Judgment: Judgment normal.         Assessment And Plan:     1. Upper respiratory infection, acute 3 week history of cold symptoms, not improving. Will treat with azithromycin Rapid covid and flu A/B both are negative - azithromycin (ZITHROMAX) 250 MG tablet; Take 2 tablets (500 mg) on  Day 1,  followed by 1 tablet (250 mg) once daily on Days 2 through 5.  Dispense: 6 each; Refill: 0 - CBC with Differential/Platelet - CMP14+EGFR  2. Non-intractable vomiting with nausea, unspecified vomiting type  She is driving so I will not administer an injection but will provide her with phenergan to take at home. I have encouraged her to eat a bland diet with the nausea - promethazine (PHENERGAN) 12.5 MG tablet; Take 1 tablet (12.5 mg total) by mouth every 6 (six) hours as needed for nausea or vomiting.  Dispense: 30 tablet; Refill: 0 - POCT Urine Pregnancy  3. Flank pain  Urinalysis was normal, this may be musculoskeletal in nature. - POCT Urinalysis Dipstick (81002)  4. Abnormal menses  Negative urine pregnancy, she has not had a menstrual cycle since June  She may need to see a GYN for further evaluation if she does not already have a provider. - POCT Urine Pregnancy   I have advised her to increase her water intake, eat more vegetables and to take benefiber daily to help with her bowels. We can discuss further at next visit   Patient was given opportunity to ask questions. Patient verbalized understanding of the plan and was able to repeat key elements of the plan. All questions were answered to their satisfaction.  Minette Brine, FNP   I, Minette Brine, FNP, have reviewed all documentation for this visit. The documentation on 02/04/20 for the exam, diagnosis, procedures, and orders are all accurate and complete.  THE PATIENT IS ENCOURAGED TO PRACTICE SOCIAL DISTANCING DUE TO THE COVID-19 PANDEMIC.

## 2020-02-05 ENCOUNTER — Emergency Department (HOSPITAL_BASED_OUTPATIENT_CLINIC_OR_DEPARTMENT_OTHER)
Admission: EM | Admit: 2020-02-05 | Discharge: 2020-02-06 | Disposition: A | Discharge status: Home / Self Care | Payer: BC Managed Care – PPO | Attending: Emergency Medicine | Admitting: Emergency Medicine

## 2020-02-05 ENCOUNTER — Emergency Department (HOSPITAL_BASED_OUTPATIENT_CLINIC_OR_DEPARTMENT_OTHER): Payer: BC Managed Care – PPO

## 2020-02-05 ENCOUNTER — Encounter (HOSPITAL_BASED_OUTPATIENT_CLINIC_OR_DEPARTMENT_OTHER): Payer: Self-pay | Admitting: *Deleted

## 2020-02-05 ENCOUNTER — Ambulatory Visit: Payer: Self-pay

## 2020-02-05 ENCOUNTER — Other Ambulatory Visit: Payer: Self-pay

## 2020-02-05 DIAGNOSIS — U071 COVID-19: Secondary | ICD-10-CM | POA: Insufficient documentation

## 2020-02-05 DIAGNOSIS — J1282 Pneumonia due to coronavirus disease 2019: Secondary | ICD-10-CM | POA: Diagnosis not present

## 2020-02-05 DIAGNOSIS — D72819 Decreased white blood cell count, unspecified: Secondary | ICD-10-CM | POA: Insufficient documentation

## 2020-02-05 DIAGNOSIS — R1110 Vomiting, unspecified: Secondary | ICD-10-CM | POA: Diagnosis present

## 2020-02-05 DIAGNOSIS — R109 Unspecified abdominal pain: Secondary | ICD-10-CM | POA: Diagnosis not present

## 2020-02-05 LAB — COMPREHENSIVE METABOLIC PANEL
ALT: 16 U/L (ref 0–44)
AST: 26 U/L (ref 15–41)
Albumin: 4.8 g/dL (ref 3.5–5.0)
Alkaline Phosphatase: 60 U/L (ref 38–126)
Anion gap: 11 (ref 5–15)
BUN: 10 mg/dL (ref 6–20)
CO2: 27 mmol/L (ref 22–32)
Calcium: 9.6 mg/dL (ref 8.9–10.3)
Chloride: 100 mmol/L (ref 98–111)
Creatinine, Ser: 0.53 mg/dL (ref 0.44–1.00)
GFR, Estimated: >60 mL/min (ref >60)
Glucose, Bld: 80 mg/dL (ref 70–99)
Potassium: 3.6 mmol/L (ref 3.5–5.1)
Sodium: 138 mmol/L (ref 135–145)
Total Bilirubin: 1 mg/dL (ref 0.3–1.2)
Total Protein: 8.7 g/dL — ABNORMAL HIGH (ref 6.5–8.1)

## 2020-02-05 LAB — CBC WITH DIFFERENTIAL/PLATELET
Abs Immature Granulocytes: 0.01 10*3/uL (ref 0.00–0.07)
Basophils Absolute: 0 10*3/uL (ref 0.0–0.2)
Basophils Absolute: 0 10*3/uL (ref 0.0–0.1)
Basophils Relative: 0 %
Basos: 0 %
EOS (ABSOLUTE): 0 10*3/uL (ref 0.0–0.4)
Eos: 0 %
Eosinophils Absolute: 0 10*3/uL (ref 0.0–0.5)
Eosinophils Relative: 0 %
HCT: 43.6 % (ref 36.0–46.0)
Hematocrit: 39.5 % (ref 34.0–46.6)
Hemoglobin: 13.2 g/dL (ref 11.1–15.9)
Hemoglobin: 14 g/dL (ref 12.0–15.0)
Immature Grans (Abs): 0 10*3/uL (ref 0.0–0.1)
Immature Granulocytes: 0 %
Immature Granulocytes: 0 %
Lymphocytes Absolute: 1.5 10*3/uL (ref 0.7–3.1)
Lymphocytes Relative: 40 %
Lymphs Abs: 1.4 10*3/uL (ref 0.7–4.0)
Lymphs: 41 %
MCH: 27.7 pg (ref 26.6–33.0)
MCH: 27.7 pg (ref 26.0–34.0)
MCHC: 33.4 g/dL (ref 31.5–35.7)
MCHC: 32.1 g/dL (ref 30.0–36.0)
MCV: 83 fL (ref 79–97)
MCV: 86.2 fL (ref 80.0–100.0)
Monocytes Absolute: 0.4 10*3/uL (ref 0.1–0.9)
Monocytes Absolute: 0.4 10*3/uL (ref 0.1–1.0)
Monocytes Relative: 12 %
Monocytes: 12 %
Neutro Abs: 1.7 10*3/uL (ref 1.7–7.7)
Neutrophils Absolute: 1.7 10*3/uL (ref 1.4–7.0)
Neutrophils Relative %: 48 %
Neutrophils: 47 %
Platelets: 257 10*3/uL (ref 150–450)
Platelets: 273 10*3/uL (ref 150–400)
RBC: 4.76 x10E6/uL (ref 3.77–5.28)
RBC: 5.06 MIL/uL (ref 3.87–5.11)
RDW: 11.5 % — ABNORMAL LOW (ref 11.7–15.4)
RDW: 11.6 % (ref 11.5–15.5)
WBC: 3.7 10*3/uL (ref 3.4–10.8)
WBC: 3.6 10*3/uL — ABNORMAL LOW (ref 4.0–10.5)
nRBC: 0 % (ref 0.0–0.2)

## 2020-02-05 LAB — URINALYSIS, ROUTINE W REFLEX MICROSCOPIC
Bilirubin Urine: NEGATIVE
Glucose, UA: NEGATIVE mg/dL
Hgb urine dipstick: NEGATIVE
Ketones, ur: >80 mg/dL — AB
Nitrite: NEGATIVE
Protein, ur: NEGATIVE mg/dL
Specific Gravity, Urine: 1.02 (ref 1.005–1.030)
pH: 6.5 (ref 5.0–8.0)

## 2020-02-05 LAB — LIPASE, BLOOD: Lipase: 21 U/L (ref 11–51)

## 2020-02-05 LAB — URINALYSIS, MICROSCOPIC (REFLEX)

## 2020-02-05 LAB — CMP14+EGFR
ALT: 9 IU/L (ref 0–32)
AST: 21 IU/L (ref 0–40)
Albumin/Globulin Ratio: 1.7 (ref 1.2–2.2)
Albumin: 4.8 g/dL (ref 3.9–5.0)
Alkaline Phosphatase: 71 IU/L (ref 44–121)
BUN/Creatinine Ratio: 10 (ref 9–23)
BUN: 6 mg/dL (ref 6–20)
Bilirubin Total: 0.5 mg/dL (ref 0.0–1.2)
CO2: 25 mmol/L (ref 20–29)
Calcium: 9.5 mg/dL (ref 8.7–10.2)
Chloride: 100 mmol/L (ref 96–106)
Creatinine, Ser: 0.6 mg/dL (ref 0.57–1.00)
GFR calc Af Amer: 144 mL/min/{1.73_m2} (ref >59)
GFR calc non Af Amer: 125 mL/min/{1.73_m2} (ref >59)
Globulin, Total: 2.9 g/dL (ref 1.5–4.5)
Glucose: 90 mg/dL (ref 65–99)
Potassium: 4.3 mmol/L (ref 3.5–5.2)
Sodium: 140 mmol/L (ref 134–144)
Total Protein: 7.7 g/dL (ref 6.0–8.5)

## 2020-02-05 LAB — RESPIRATORY PANEL BY RT PCR (FLU A&B, COVID)
Influenza A by PCR: NEGATIVE
Influenza B by PCR: NEGATIVE
SARS Coronavirus 2 by RT PCR: POSITIVE — AB

## 2020-02-05 LAB — PREGNANCY, URINE: Preg Test, Ur: NEGATIVE

## 2020-02-05 MED ORDER — ACETAMINOPHEN 500 MG PO TABS
1000.0000 mg | ORAL_TABLET | Freq: Once | ORAL | Status: AC
  Administered 2020-02-05: 1000 mg via ORAL
  Filled 2020-02-05: qty 2

## 2020-02-05 MED ORDER — ONDANSETRON HCL 4 MG/2ML IJ SOLN
4.0000 mg | Freq: Once | INTRAMUSCULAR | Status: AC
  Administered 2020-02-05: 4 mg via INTRAVENOUS
  Filled 2020-02-05: qty 2

## 2020-02-05 MED ORDER — ONDANSETRON HCL 4 MG PO TABS: 4.0000 mg | ORAL_TABLET | Freq: Three times a day (TID) | ORAL | 1 refills | Status: DC | PRN

## 2020-02-05 MED ORDER — LACTATED RINGERS IV BOLUS
1000.0000 mL | Freq: Once | INTRAVENOUS | Status: AC
  Administered 2020-02-05: 1000 mL via INTRAVENOUS

## 2020-02-05 NOTE — Telephone Encounter (Signed)
   Answer Assessment - Initial Assessment Questions 1. VOMITING SEVERITY: "How many times have you vomited in the past 24 hours?"     - MILD:  1 - 2 times/day    - MODERATE: 3 - 5 times/day, decreased oral intake without significant weight loss or symptoms of dehydration    - SEVERE: 6 or more times/day, vomits everything or nearly everything, with significant weight loss, symptoms of dehydration      2 times  2. ONSET: "When did the vomiting begin?"      About 3 weeks head cold ccough 3. FLUIDS: "What fluids or food have you vomited up today?" "Have you been able to keep any fluids down?"     gatoraid 4. ABDOMINAL PAIN: "Are your having any abdominal pain?" If yes : "How bad is it and what does it feel like?" (e.g., crampy, dull, intermittent, constant)      yes 5. DIARRHEA: "Is there any diarrhea?" If Yes, ask: "How many times today?"      Watery today but just today 6. CONTACTS: "Is there anyone else in the family with the same symptoms?"      *No Answer* 7. CAUSE: "What do you think is causing your vomiting?"     *No Answer* 8. HYDRATION STATUS: "Any signs of dehydration?" (e.g., dry mouth [not only dry lips], too weak to stand) "When did you last urinate?"     *No Answer* 9. OTHER SYMPTOMS: "Do you have any other symptoms?" (e.g., fever, headache, vertigo, vomiting blood or coffee grounds, recent head injury)     *No Answer* 10. PREGNANCY: "Is there any chance you are pregnant?" "When was your last menstrual period?"       *No Answer*  Protocols used: [facility]

## 2020-02-05 NOTE — Telephone Encounter (Signed)
Patient called stating that she has been sick for 3 weeks.  Today she states that she is vomiting her stomach lining. (yellow mucus)  She saw her PCP yesterday and was given medication for vomiting but it was not available.  She states that she has tested negative for COVID-19  She uses Depo for birth control. She states she is not able to eat.  She describes her urine as light yellow.  She had watery diarrhea one time today.  All other days she had normal BMs' Per protocol she was told to reach out to her PCP for further evaluation or go to ER for care.  Care advice was read to patient.  She verbalized understanding and will call her PCP.  Reason for Disposition . [1] Constant abdominal pain AND [2] present > 2 hours  Answer Assessment - Initial Assessment Questions 1. VOMITING SEVERITY: "How many times have you vomited in the past 24 hours?"     - MILD:  1 - 2 times/day    - MODERATE: 3 - 5 times/day, decreased oral intake without significant weight loss or symptoms of dehydration    - SEVERE: 6 or more times/day, vomits everything or nearly everything, with significant weight loss, symptoms of dehydration      2 times  2. ONSET: "When did the vomiting begin?"      About 3 weeks head cold ccough 3. FLUIDS: "What fluids or food have you vomited up today?" "Have you been able to keep any fluids down?"     gatoraid 4. ABDOMINAL PAIN: "Are your having any abdominal pain?" If yes : "How bad is it and what does it feel like?" (e.g., crampy, dull, intermittent, constant)      yes 5. DIARRHEA: "Is there any diarrhea?" If Yes, ask: "How many times today?"      Watery today but just today 6. CONTACTS: "Is there anyone else in the family with the same symptoms?"      Boyfriend had symptoms but gone just cough 7. CAUSE: "What do you think is causing your vomiting?"     unsure 8. HYDRATION STATUS: "Any signs of dehydration?" (e.g., dry mouth [not only dry lips], too weak to stand) "When did you last  urinate?"    Lips dry, yes urine is light yellow 9. OTHER SYMPTOMS: "Do you have any other symptoms?" (e.g., fever, headache, vertigo, vomiting blood or coffee grounds, recent head injury)     Headache, 10. PREGNANCY: "Is there any chance you are pregnant?" "When was your last menstrual period?"       No depo  Protocols used: [facility]

## 2020-02-05 NOTE — ED Notes (Signed)
For the past three weeks states she has had a head cold, rec medication from her MD, after taking medication began having nausea and vomiting, after drinking water and gatorade she states had worse nausea and vomiting. States she has had several covid test that indicate a negative result.

## 2020-02-05 NOTE — ED Triage Notes (Addendum)
C/o vomiting " on and off" x 1 week , seen by PMD yesterday DX UR flu and covid negI, c/o nasal congestion , and requesting oxygen , 0s sat 100 in triage , taking in complete sentences , pt states she going to lay on the floor because she hot, fall risk band placed on pt and instructed pt not to lay on floor, family with pt

## 2020-02-05 NOTE — ED Notes (Signed)
Dr Madilyn Hook notified of positive covid result

## 2020-02-05 NOTE — ED Notes (Signed)
Medicated for generalized aching and PO fluid challenge also initiated

## 2020-02-05 NOTE — ED Notes (Addendum)
Called to front parking lot by greeter, pt sitting on concrete in middle of the drive thru family with pt , c/o to weak to walk and SOB, pt with only a bra on and pants , pt explained she hot , pt assisted to [facility] and instructed to put on shirt before entering building for registration.

## 2020-02-05 NOTE — ED Notes (Signed)
Patient aware that we need urine sample for testing, unable at this time. Pt given instruction on providing urine sample when able to do so. Pt says to "check back in 15 minutes"

## 2020-02-05 NOTE — ED Provider Notes (Signed)
[facility] EMERGENCY DEPARTMENT Provider Note   CSN: [id number] Arrival date & time: 02/05/20  1545     History Chief Complaint  Patient presents with  . Emesis    Tiffany Ball is a 29 y.o. female.  HPI   Tiffany Ball is a 29 y.o. lady with PMHx hypercholesterolemia and vitamin D deficiency, presenting due to 5 episodes of clear emesis since yesterday evening. She states that she first noticed right flank pain about 1.5 weeks ago followed by nausea and decreased PO intake that started last week. She states her right flank pain is a sharp, intermittent pain that is exacerbated by movement. She notes that she developed bilateral lower abdominal pain about the same time she started to vomit, starting last night. She endorses associated chills but denies any fevers. She states she recently had been constipated over the past month or so with stools every 2-3 days that were hard, but had an episode of watery stool that was not dark or bloody last night. She says she has not been sexually active in about 1 month. She does have a history of intercourse without condoms, but denies any history of urinary symptoms, UTI, STD, or kidney stones. She notes she has had nasal congestion and a left-sided frontal headache with post-nasal drip and mild wet cough over the past 3 weeks. She endorses associated loss in her smell. She has had friends with similar symptoms who tested negative for COVID and she personally tested negative for COVID after being seen in an IM office yesterday.  Past Medical History:  Diagnosis Date  . Hypercholesteremia   . Vitamin D deficiency     There are no problems to display for this patient.   Past Surgical History:  Procedure Laterality Date  . knee cyst  2008  . KNEE SURGERY     right  . WISDOM TOOTH EXTRACTION       OB History    Gravida  1   Para  0   Term  0   Preterm  0   AB  0   Living        SAB  0   TAB  0   Ectopic  0    Multiple      Live Births              No family history on file.  Patient denies relative past family Hx.   Social History   Tobacco Use  . Smoking status: Never Smoker  . Smokeless tobacco: Never Used  Vaping Use  . Vaping Use: Never used  Substance Use Topics  . Alcohol use: Yes    Comment: social  . Drug use: No    Home Medications Prior to Admission medications   Medication Sig Start Date End Date Taking? Authorizing Provider  azithromycin (ZITHROMAX) 250 MG tablet Take 2 tablets (500 mg) on  Day 1,  followed by 1 tablet (250 mg) once daily on Days 2 through 5. 02/04/20 02/09/20  Arnette Felts, FNP  ondansetron (ZOFRAN) 4 MG tablet Take 1 tablet (4 mg total) by mouth every 8 (eight) hours as needed for nausea or vomiting. 02/05/20 02/04/21  Glenford Bayley, MD  promethazine (PHENERGAN) 12.5 MG tablet Take 1 tablet (12.5 mg total) by mouth every 6 (six) hours as needed for nausea or vomiting. 02/04/20   Arnette Felts, FNP    Allergies    Plan b [levonorgestrel]  Review of Systems   Review of Systems:  10-point review of systems otherwise negative except as noted above in HPI.   Physical Exam Updated Vital Signs BP 128/78 (BP Location: Right Arm)   Pulse 92   Temp 98 F (36.7 C) (Oral)   Resp 18   Ht 5' (1.524 m)   Wt 47.2 kg   SpO2 95%   BMI 20.31 kg/m   Physical Exam   General: Patient appears tremulous and uncomfortable, in mild acute distress.  Eyes: Sclera non-icteric. No conjunctival injection.  HENT: Slightly dry mucus membranes. No nasal discharge.  Respiratory: Lungs are CTA, bilaterally. No wheezes, rales, or rhonchi. No tachypnea, no increased work of breathing.   Cardiovascular: Rate is tachycardic, rhythm is regular. No murmurs, rubs, or gallops. No lower extremity edema. Abdominal: Soft and not distended. There is significant bilateral lower abdominal tenderness to palpation. Mild epigastric tenderness to palpation. There is voluntary guarding,  but no rebound. Bowel sounds intact. Neurological: Patient is alert and oriented x 3. CN II-XII grossly intact.  Musculoskeletal: ROMI in all four extremities with normal muscle bulk.  Skin: There are bilateral linear scars in lower abdominal quadrants that are well healed. No other lesions or rashes noted.  Psych: Normal affect. Normal tone of voice.   ED Results / Procedures / Treatments   Labs (all labs ordered are listed, but only abnormal results are displayed) Labs Reviewed  RESPIRATORY PANEL BY RT PCR (FLU A&B, COVID) - Abnormal; Notable for the following components:      Result Value   SARS Coronavirus 2 by RT PCR POSITIVE (*)    All other components within normal limits  CBC WITH DIFFERENTIAL/PLATELET - Abnormal; Notable for the following components:   WBC 3.6 (*)    All other components within normal limits  COMPREHENSIVE METABOLIC PANEL - Abnormal; Notable for the following components:   Total Protein 8.7 (*)    All other components within normal limits  URINALYSIS, ROUTINE W REFLEX MICROSCOPIC - Abnormal; Notable for the following components:   APPearance CLOUDY (*)    Ketones, ur >80 (*)    Leukocytes,Ua TRACE (*)    All other components within normal limits  URINALYSIS, MICROSCOPIC (REFLEX) - Abnormal; Notable for the following components:   Bacteria, UA MANY (*)    All other components within normal limits  URINE CULTURE  LIPASE, BLOOD  PREGNANCY, URINE    EKG None  Radiology CT RENAL STONE STUDY  Result Date: 02/05/2020 CLINICAL DATA:  Right flank region pain with cough and vomiting EXAM: CT ABDOMEN AND PELVIS WITHOUT CONTRAST TECHNIQUE: Multidetector CT imaging of the abdomen and pelvis was performed following the standard protocol without oral or IV contrast. COMPARISON:  None. FINDINGS: Lower chest: There is airspace consolidation in the right lower lobe. There are ill-defined nodular opacities in each lung base without cavitation. Hepatobiliary: No focal  liver lesions are appreciable on this noncontrast enhanced study. Gallbladder wall is not appreciably thickened. There is no biliary duct dilatation. Pancreas: No pancreatic mass or inflammatory focus. Spleen: No splenic lesions are evident. Adrenals/Urinary Tract: Adrenals bilaterally appear normal. There is no evident renal mass or hydronephrosis on either side. There is no appreciable renal or ureteral calculus on either side. Urinary bladder is midline with wall thickness within normal limits. Stomach/Bowel: There is no appreciable bowel wall or mesenteric thickening. No demonstrable bowel obstruction. The terminal ileum appears normal. There is no evident free air or portal venous air. Vascular/Lymphatic: No abdominal aortic aneurysm. No vascular lesions evident on this noncontrast enhanced  study. There is no appreciable adenopathy in the abdomen or pelvis. Reproductive: Uterus is anteverted.  No evident pelvic mass. Other: Appendix appears normal. There is no abscess or ascites in the abdomen or pelvis. Musculoskeletal: There are no blastic or lytic bone lesions. No abdominal wall or intramuscular lesions are evident. IMPRESSION: 1. Areas of infiltrate in the lung bases, most pronounced in the right lower lobe, consistent with pneumonia. Small nodular opacities likely represent foci of pneumonia. No cavitation evident currently. Advise check of COVID-19 status given this apparent. 2. No renal or ureteral calculi. No hydronephrosis on either side. Urinary bladder wall thickness is normal. 3. No evident bowel wall thickening or bowel obstruction. No abscess in the abdomen or pelvis. Appendix appears normal. Electronically Signed   By: Bretta Bang III M.D.   On: 02/05/2020 20:47    Procedures Procedures (including critical care time)  Medications Ordered in ED Medications  lactated ringers bolus 1,000 mL (0 mLs Intravenous Stopped 02/05/20 2054)  ondansetron (ZOFRAN) injection 4 mg (4 mg Intravenous  Given 02/05/20 1937)  acetaminophen (TYLENOL) tablet 1,000 mg (1,000 mg Oral Given 02/05/20 2254)    ED Course  I have reviewed the triage vital signs and the nursing notes.  Pertinent labs & imaging results that were available during my care of the patient were reviewed by me and considered in my medical decision making (see chart for details).    MDM Rules/Calculators/A&P                          Patient's intermittent right flank pain with CVA tenderness on examination associated with nausea, vomiting, chills, and lower abdominal pain are concerning for acute obstructive kidney stone or pyelonephritis; however, patient denies any dysuria, hematuria, or other urinary symptoms. STI with PID is also on the differential, especially given history of sexual activity without condom use, although patient denies vaginal discharge or hx of STI. Also consider acute gastritis and pancreatitis due to mild epigastric pain with N/V, chronic constipation, COVID in the setting of upper respiratory symptoms x 3 weeks with anosmia and mild wet cough; however, she tested negative for COVID-19 yesterday.  - Will recheck urine pregnancy, U/A, CBC, CMP, lipase - Will check CT renal stone study to rule out obstructing stone  - Will give 1L LR bolus given dry MM and tachycardia - Will give Zofran 4mg  IV   9:02pm: CBC is remarkable for leukopenia with WBC 3.6 and unremarkable differential.  CMP is unremarkable with normal renal and liver function and electrolytes.  CT renal study showed no urolithiasis, hydronephrosis, or bladder wall thickening, with no evidence of bowel wall thickening, obstruction, abscess, or appendicitis. There were areas of infiltrate in the bilateral lung bases, worse on the right, consistent with PNA without cavitation, concerning for COVID-19 pneumonia.  - Will repeat respiratory panel by RT PCR to r/o COVID-19 and influenza A&B - Will try fluid/PO challenge  10:05pm: COVID-19 PCR testing  returned positive. Patient was informed of this result and informed that per CDC guidelines she will need to quarantine for 10 days and recommend close contacts be tested for COVID-19 infection. She continues to endorse back pain. She states she is thirsty and would like to try eating and drinking. She endorses a lot of anxiety regarding this diagnosis.  - Encouraged PO intake, and will obtain urine sample after fluids - Will give tylenol 1000mg  for pain  11:18pm:  Will try again to obtain urine sample.  Informed patient that she should stop taking Azithromycin as her pneumonia and symptoms are likely due to COVID-19 which does not respond to antibiotics. She was informed that she may be started on a new antibiotic if her urine comes back concerning for UTI vs. Pyelonephritis. Will prescribe Zofran 4mg  PO PRN for nausea at home with instruction to return to the ED for worsening shortness of breath, persistent nausea and vomiting with inability to tolerate PO fluids, worsening flank pain, persistent fever despite Tylenol, or any other concerning symptoms.   11:53pm: Patient's pregnancy test returned negative. U/A did show trace leukocytes and many bacteria, but no nitrites, 11-20 squamous epithelium, and no WBC's, most likely due to contaminated specimen rather than acute infection; however, will send urine culture.   Final Clinical Impression(s) / ED Diagnoses Final diagnoses:  Pneumonia due to COVID-19 virus  Leukopenia, unspecified type    Rx / DC Orders ED Discharge Orders         Ordered    ondansetron (ZOFRAN) 4 MG tablet  Every 8 hours PRN        02/05/20 2342         Glenford Bayleyachel Dinia Joynt, MD 02/05/2020, 11:56 PM Pager: [phone]    Glenford BayleySpeakman, Krina Mraz, MD 02/05/20 2356    Tilden Fossaees, Elizabeth, MD 02/06/20 0011

## 2020-02-05 NOTE — ED Notes (Signed)
Prior to medication administration, pt was questioned in regards to allergy status, pt stated to this RN she was allergic to peanuts and no to any medications

## 2020-02-05 NOTE — Discharge Instructions (Addendum)
Tiffany Ball,  Your sinus pressure, headache, flank pain, abdominal pain, nausea and vomiting are likely explained by COVID-19 pneumonia that you were found to have during your stay here in the ED.  Your urine pregnancy test was negative.Your CT scan did not show any evidence of kidney stones or other concerning findings. Your urine sample did not show an obvious urinary tract infection so I will not start you on new antibiotics now; however, I have sent in a urine culture to double check. If this returns positive, you may be started on antibiotics for a UTI.   Per CDC guidelines, you should quarantine in isolation for 10 days and inform your close contacts of your diagnosis so that they may get tested.   You should STOP taking Azithromycin, as this does not treat COVID-19 pneumonia. You may continue taking Tylenol 650mg  every 6 hours as needed for fever or pain. I have prescribed Zofran to the [facility] on [address] [place]: [phone] as this pharmacy is open 24/7. Please return to the ED if you find that you are not able to tolerate oral intake despite this medication, if you experience worsening shortness of breath, persistent fevers despite tylenol, or other concerning symptoms.   You should wait 3 months (90 days) prior to receiving a COVID-19 vaccination if this is something you decide to pursue.   Thank you and take care,  Dr. [phone]

## 2020-02-05 NOTE — ED Notes (Signed)
Was prescribed Zithromax and Promethazine, got the Zithromax, but did not get the promethazine

## 2020-02-05 NOTE — ED Notes (Signed)
Pt informed she was Covid Positive, also c/o pain and requesting to eat, attending EDP informed

## 2020-02-06 ENCOUNTER — Telehealth (HOSPITAL_COMMUNITY): Payer: Self-pay

## 2020-02-06 NOTE — Telephone Encounter (Signed)
Called to Discuss with patient about Covid symptoms and the use of the monoclonal antibody infusion for those with mild to moderate Covid symptoms and at a high risk of hospitalization.        Pt refused monoclonal antibody treatment for COVID-19.

## 2020-02-07 LAB — URINE CULTURE: Culture: <10000 — AB

## 2020-02-08 IMAGING — CR DG ABDOMEN ACUTE W/ 1V CHEST
3 series · 3 of 3 positions shown · non-contrast
Comparison: None.

CLINICAL DATA: Abdominal pain with nausea and vomiting

EXAM:
DG ABDOMEN ACUTE W/ 1V CHEST

[w chest pa]
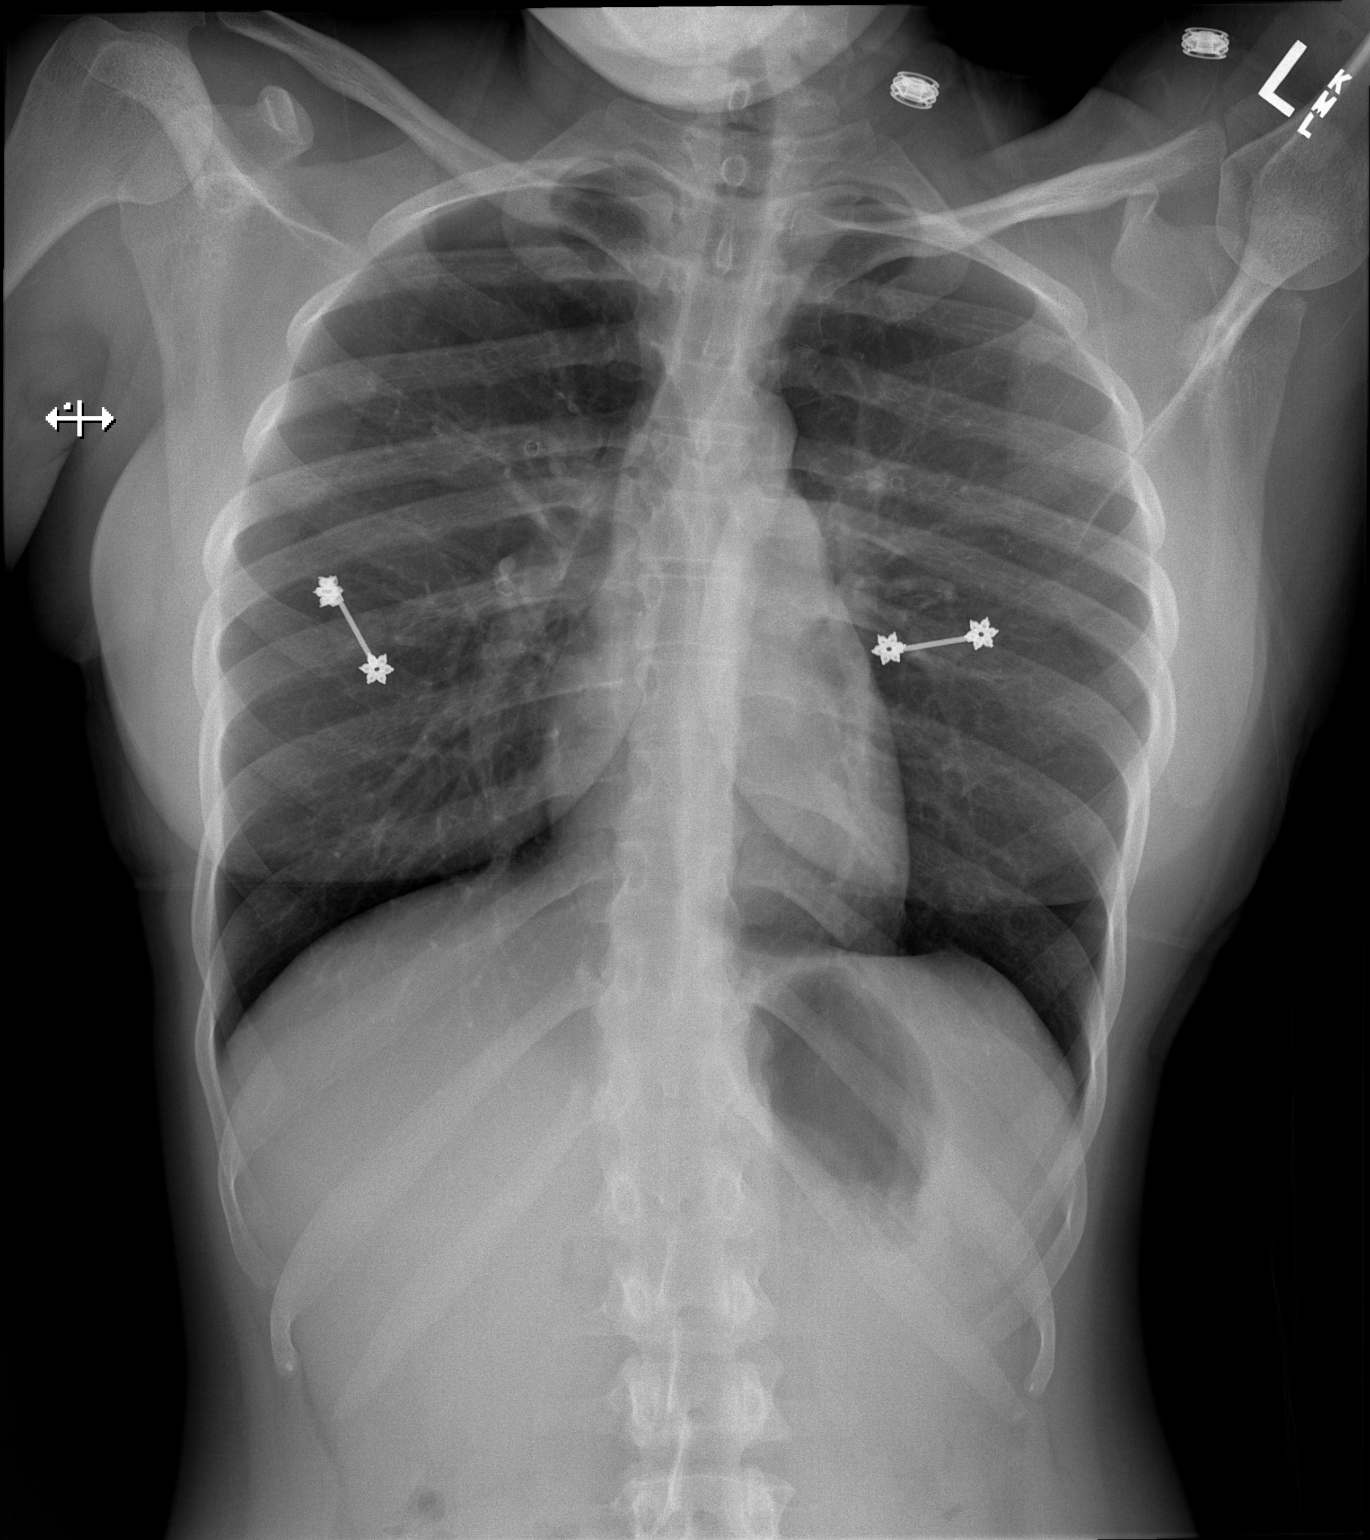

[w abdomen upright]
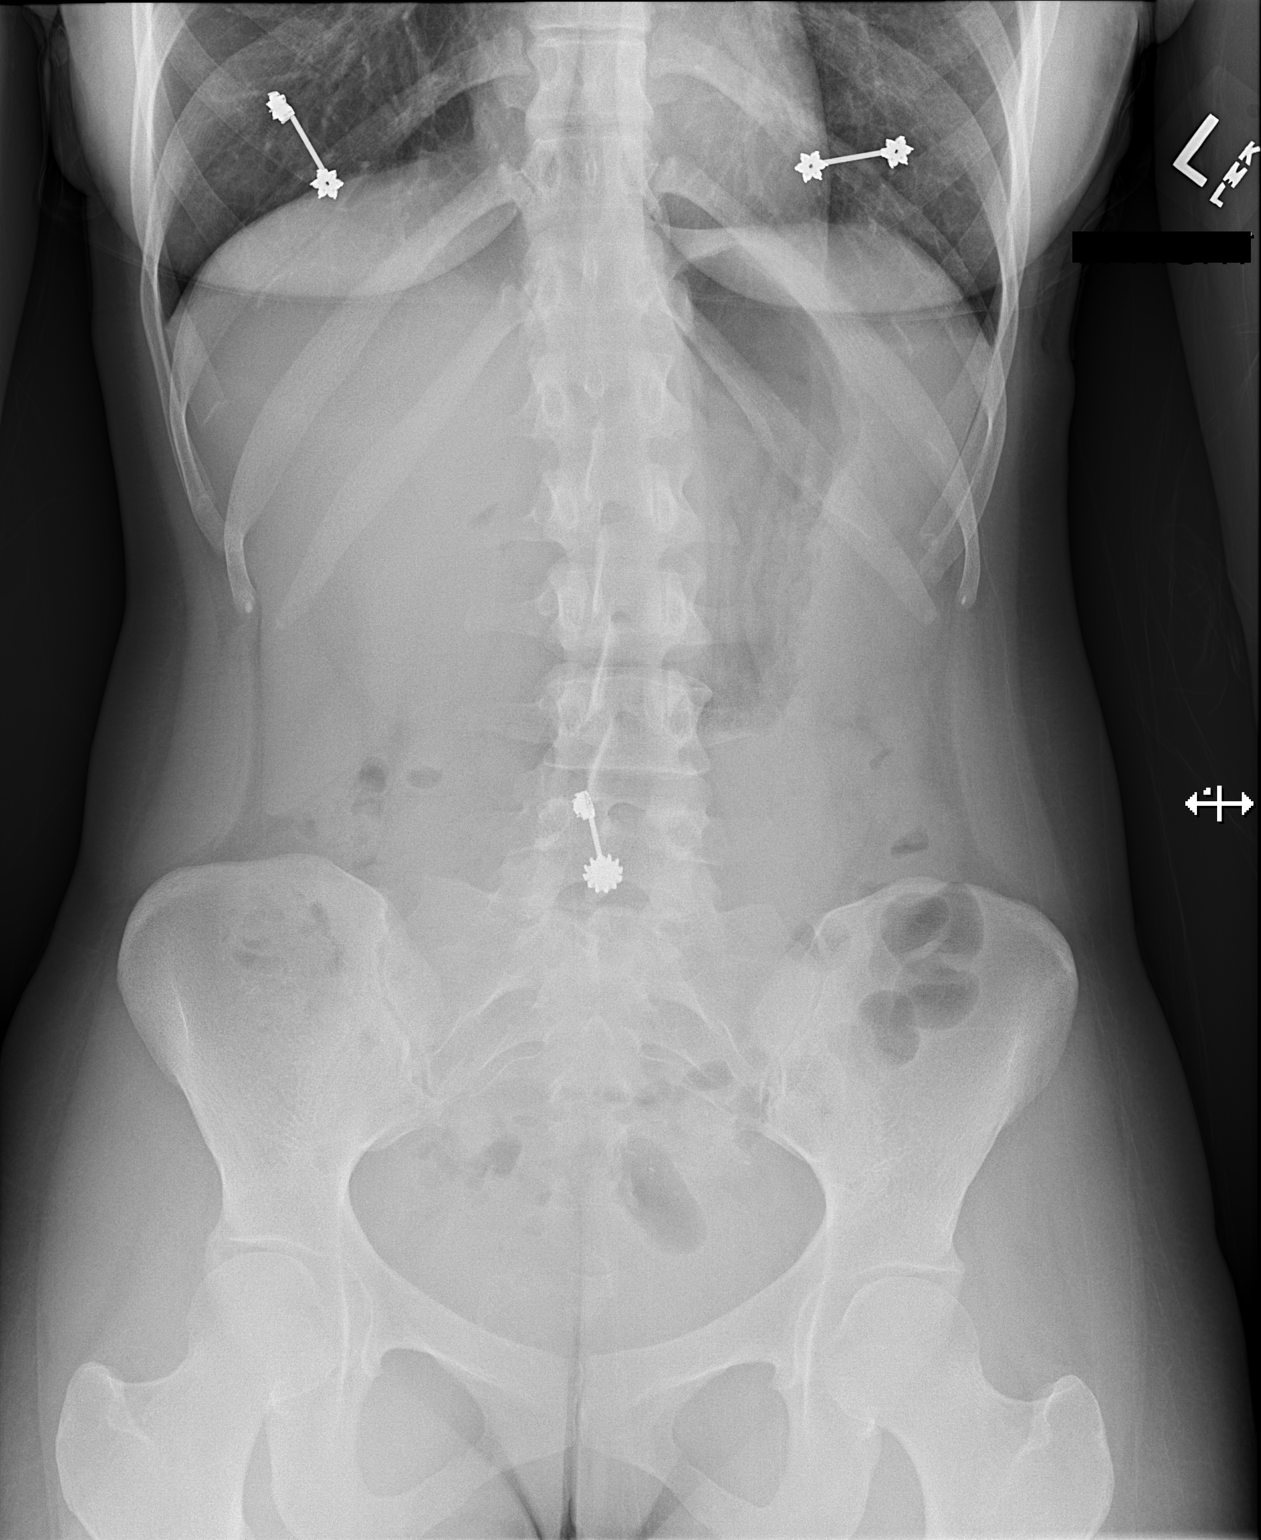

[t abdomen supine]
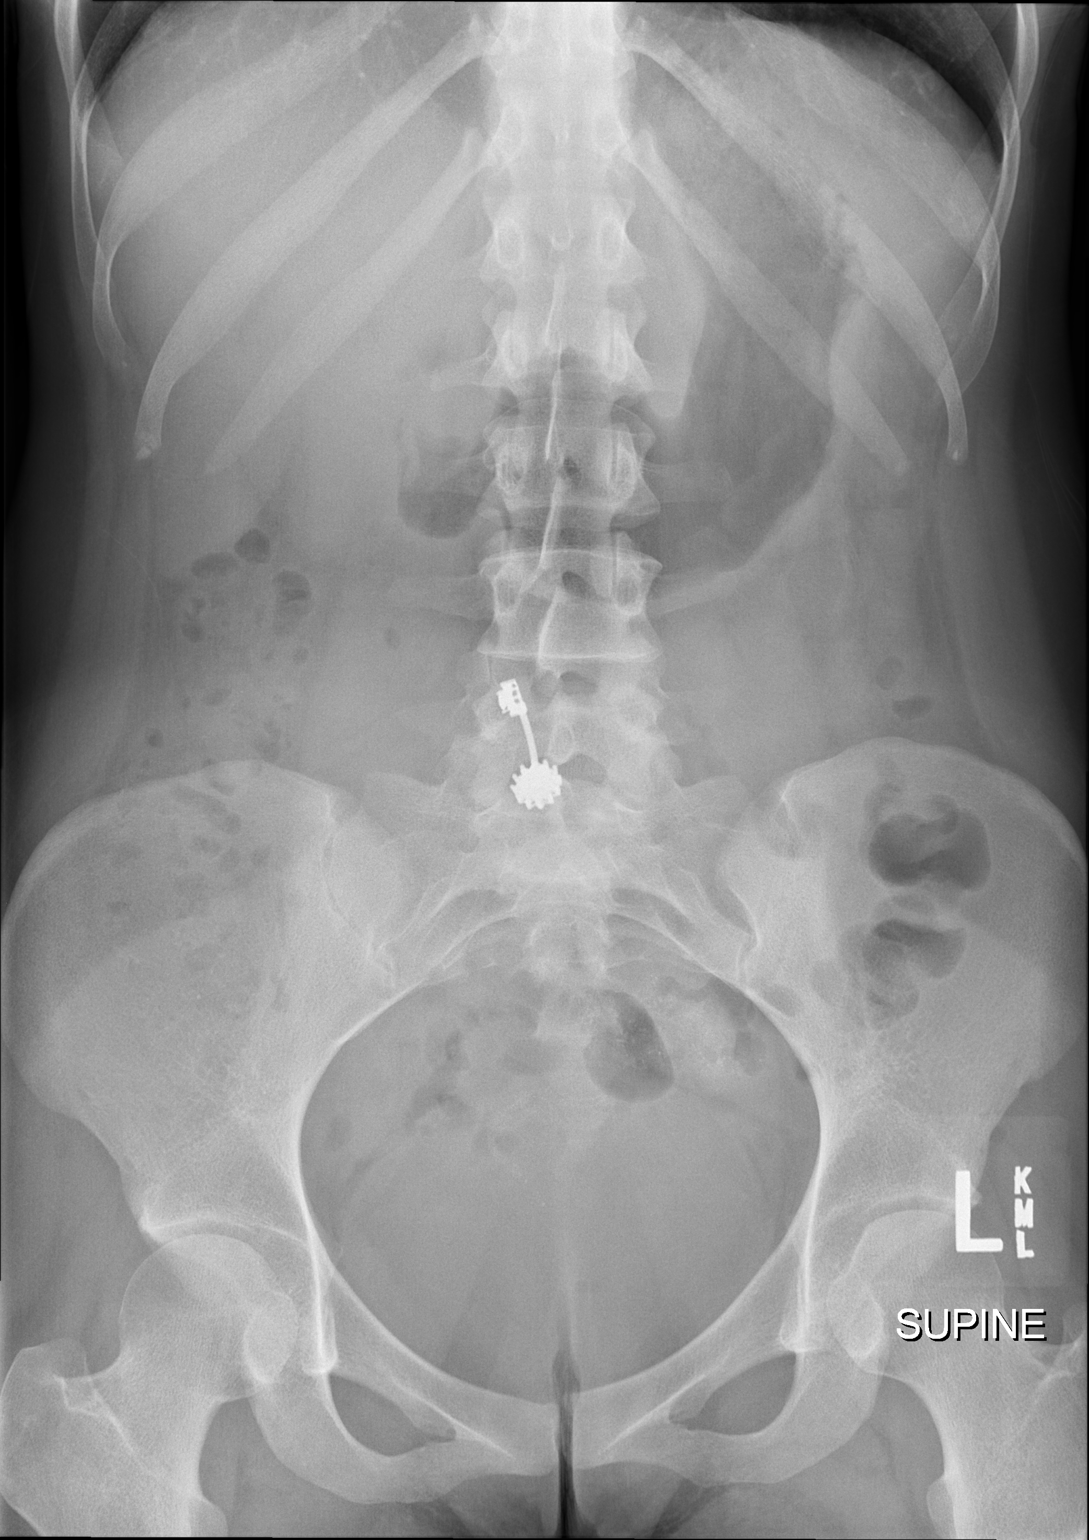

[3 of 3 positions shown; findings below may reference images not displayed]

FINDINGS: PA chest: No edema or consolidation. Heart size and pulmonary
vascularity are normal. No adenopathy.

Supine and upright abdomen: There is moderate stool in the colon.
There is no bowel dilatation or air-fluid level to suggest bowel
obstruction. No free air. No abnormal calcification. Body piercings
noted.
IMPRESSION: No bowel obstruction or free air.  No lung edema or consolidation.

## 2020-09-05 ENCOUNTER — Encounter (HOSPITAL_COMMUNITY): Payer: Self-pay | Admitting: Emergency Medicine

## 2020-09-05 ENCOUNTER — Other Ambulatory Visit: Payer: Self-pay

## 2020-09-05 ENCOUNTER — Ambulatory Visit (HOSPITAL_COMMUNITY)
Admission: EM | Admit: 2020-09-05 | Discharge: 2020-09-05 | Disposition: A | Discharge status: Home / Self Care | Payer: BC Managed Care – PPO | Attending: Emergency Medicine | Admitting: Emergency Medicine

## 2020-09-05 DIAGNOSIS — K047 Periapical abscess without sinus: Secondary | ICD-10-CM | POA: Diagnosis not present

## 2020-09-05 MED ORDER — LIDOCAINE VISCOUS HCL 2 % MT SOLN: 15.0000 mL | OROMUCOSAL | 0 refills | Status: DC | PRN

## 2020-09-05 MED ORDER — AMOXICILLIN-POT CLAVULANATE 875-125 MG PO TABS: 1.0000 | ORAL_TABLET | Freq: Two times a day (BID) | ORAL | 0 refills | Status: DC

## 2020-09-05 MED ORDER — HYDROCODONE-ACETAMINOPHEN 5-325 MG PO TABS: 2.0000 | ORAL_TABLET | ORAL | 0 refills | Status: AC | PRN

## 2020-09-05 NOTE — Discharge Instructions (Addendum)
Take the Norco as needed for pain.  It can make you sleepy so don't take it prior to driving.   Don't take any additional Tylenol while taking the Norco but you can take Ibuprofen for any break through pain.    Take the Augmentin twice a day for the next 7 days.   You can soak cotton balls in the lidocaine and apply it to your mouth to help with pain.  You can also try warm salt water gargles.    Follow up with a dentist as soon as possible.

## 2020-09-05 NOTE — ED Triage Notes (Signed)
Pt is present today with right side dental pain and facial swelling. Pt states that her pain started Monday and has radiated to her ear. Pt states that her dental office prescribe her ibuprofen but that has not helped. Pt dental office sent a referral to a dental surgeon and the soonest appt is not until 09/17/2020

## 2020-09-05 NOTE — ED Provider Notes (Signed)
[facility]   [id number] 09/05/20 Arrival Time: 1440  CC: DENTAL pain  SUBJECTIVE:  Tiffany Ball is a 30 y.o. female who presents with dental pain and swelling. Denies a precipitating event or trauma. Localizes pain to right lower jaw. Has tried OTC analgesics without relief. Worse with chewing. Reports following up with dentist and was prescribed ibuprofen with no relief.  Reports having appointment for dental surgeon on 6/22 as that was the earliest.  Denies fever, chills, dysphagia, odynophagia, oral or neck swelling, nausea, vomiting, chest pain, SOB.    ROS: As per HPI.  All other pertinent ROS negative.     Past Medical History:  Diagnosis Date   Hypercholesteremia    Vitamin D deficiency    Past Surgical History:  Procedure Laterality Date   knee cyst  2008   KNEE SURGERY     right   WISDOM TOOTH EXTRACTION     Allergies  Allergen Reactions   Plan B [Levonorgestrel] Anaphylaxis    Was eating peanuts when took this so not sure if it was plan b or peanuts.   No current facility-administered medications on file prior to encounter.   Current Outpatient Medications on File Prior to Encounter  Medication Sig Dispense Refill   ondansetron (ZOFRAN) 4 MG tablet Take 1 tablet (4 mg total) by mouth every 8 (eight) hours as needed for nausea or vomiting. 30 tablet 1   promethazine (PHENERGAN) 12.5 MG tablet Take 1 tablet (12.5 mg total) by mouth every 6 (six) hours as needed for nausea or vomiting. 30 tablet 0   Social History   Socioeconomic History   Marital status: Single    Spouse name: Not on file   Number of children: Not on file   Years of education: Not on file   Highest education level: Not on file  Occupational History   Not on file  Tobacco Use   Smoking status: Never   Smokeless tobacco: Never  Vaping Use   Vaping Use: Never used  Substance and Sexual Activity   Alcohol use: Yes    Comment: social   Drug use: No   Sexual activity: Not  Currently    Birth control/protection: Pill  Other Topics Concern   Not on file  Social History Narrative   ** Merged History Encounter **       Social Determinants of Health   Financial Resource Strain: Not on file  Food Insecurity: Not on file  Transportation Needs: Not on file  Physical Activity: Not on file  Stress: Not on file  Social Connections: Not on file  Intimate Partner Violence: Not on file   History reviewed. No pertinent family history.  OBJECTIVE:  Vitals:   09/05/20 1450  BP: (!) 137/94  Pulse: 96  Resp: 19  Temp: 98.7 F (37.1 C)  SpO2: 100%    General appearance: alert; no distress HENT: normocephalic; atraumatic; dentition: multiple carries; abscess present- right lower  without areas of fluctuance Neck: supple without LAD Lungs: normal respirations Skin: warm and dry Psychological: alert and cooperative; normal mood and affect  ASSESSMENT & PLAN:  1. Dental abscess     Meds ordered this encounter  Medications   HYDROcodone-acetaminophen (NORCO/VICODIN) 5-325 MG tablet    Sig: Take 2 tablets by mouth every 4 (four) hours as needed for up to 5 days.    Dispense:  10 tablet    Refill:  0    Order Specific Question:   Supervising Provider  AnswerMerrilee Jansky [[id number]]   amoxicillin-clavulanate (AUGMENTIN) 875-125 MG tablet    Sig: Take 1 tablet by mouth every 12 (twelve) hours.    Dispense:  14 tablet    Refill:  0    Order Specific Question:   Supervising Provider    Answer:   Merrilee Jansky [[id number]]   lidocaine (XYLOCAINE) 2 % solution    Sig: Use as directed 15 mLs in the mouth or throat as needed for mouth pain.    Dispense:  100 mL    Refill:  0    Order Specific Question:   Supervising Provider    Answer:   Merrilee Jansky [id number]    Augmentin prescribed Norco prescribed and PDMP reviewed.  Use as directed for pain relief.  Viscous Lidocaine PRN for pain relief.   Recommend soft diet until evaluated by  dentist Maintain oral hygiene care Follow up with dentist as soon as possible for further evaluation and treatment  Return or go to the ED if you have any new or worsening symptoms such as fever, chills, difficulty swallowing, painful swallowing, oral or neck swelling, nausea, vomiting, chest pain, SOB.  Reviewed expectations re: course of current medical issues. Questions answered. Outlined signs and symptoms indicating need for more acute intervention. Patient verbalized understanding. After Visit Summary given.    Ivette Loyal, NP 09/05/20 [phone]

## 2020-09-23 ENCOUNTER — Ambulatory Visit: Payer: BC Managed Care – PPO | Admitting: Nurse Practitioner

## 2020-09-23 ENCOUNTER — Encounter: Payer: Self-pay | Admitting: Nurse Practitioner

## 2020-09-23 ENCOUNTER — Other Ambulatory Visit: Payer: Self-pay

## 2020-09-23 VITALS — BP 108/68 | HR 84 | Temp 98.7°F | Ht 61.6 in | Wt 107.4 lb

## 2020-09-23 DIAGNOSIS — M79605 Pain in left leg: Secondary | ICD-10-CM

## 2020-09-23 DIAGNOSIS — E559 Vitamin D deficiency, unspecified: Secondary | ICD-10-CM

## 2020-09-23 DIAGNOSIS — R202 Paresthesia of skin: Secondary | ICD-10-CM | POA: Diagnosis not present

## 2020-09-23 DIAGNOSIS — Z114 Encounter for screening for human immunodeficiency virus [HIV]: Secondary | ICD-10-CM

## 2020-09-23 DIAGNOSIS — Z1159 Encounter for screening for other viral diseases: Secondary | ICD-10-CM

## 2020-09-23 DIAGNOSIS — M79604 Pain in right leg: Secondary | ICD-10-CM | POA: Diagnosis not present

## 2020-09-23 DIAGNOSIS — Z2821 Immunization not carried out because of patient refusal: Secondary | ICD-10-CM

## 2020-09-23 NOTE — Patient Instructions (Signed)
Take tumeric supplement or tea to help with the pain. Vitamin d supplement at least 2,000 mcg daily.

## 2020-09-23 NOTE — Progress Notes (Signed)
I,Yamilka Roman [employer] as a Neurosurgeon for [employer], FNP.,have documented all relevant documentation on the behalf of Arnette Felts, FNP,as directed by  Arnette Felts, FNP while in the presence of Arnette Felts, FNP.  This visit occurred during the SARS-CoV-2 public health emergency.  Safety protocols were in place, including screening questions prior to the visit, additional usage of staff PPE, and extensive cleaning of exam room while observing appropriate contact time as indicated for disinfecting solutions.  Subjective:     Patient ID: Tiffany Ball , female    DOB: 11-06-1990 , 30 y.o.   MRN: [id number]   Chief Complaint  Patient presents with   Leg Pain    HPI  Patient stated she recently stopped taking birth control injection and ever since she got off the depo a year ago her legs have been hurting. She stated the pain comes and goes. When she has the pain it is so bad she doesn't want to do anything.  She did not take any calcium or vitamin d when taking depo.  She has tried CBD oil, icy hot or biofreeze. She will also use a heating pad. She works at [employer], she does do a lot of walking around. Sharp pains, denies back pain. Denies urinary or bowel incontinence.   Leg Pain  There was no injury mechanism. The pain is present in the left leg and right leg. The quality of the pain is described as aching, burning, cramping, shooting and stabbing. The pain is at a severity of 0/10. The pain is moderate. The pain has been Intermittent since onset. Associated symptoms include muscle weakness and numbness (feet). Pertinent negatives include no tingling. She reports no foreign bodies present. Treatments tried: she tried CBD oil. The treatment provided mild relief.    Past Medical History:  Diagnosis Date   Hypercholesteremia    Vitamin D deficiency      History reviewed. No pertinent family history.  No current outpatient medications on file.   Allergies  Allergen  Reactions   Plan B [Levonorgestrel] Anaphylaxis    Was eating peanuts when took this so not sure if it was plan b or peanuts.     Review of Systems  Constitutional: Negative.  Negative for fatigue.  Respiratory: Negative.    Cardiovascular: Negative.   Musculoskeletal:        Bilateral lower extremity pain  Neurological:  Positive for numbness (feet). Negative for tingling.  Psychiatric/Behavioral: Negative.      Today's Vitals   09/23/20 1601  BP: 108/68  Pulse: 84  Temp: 98.7 F (37.1 C)  Weight: 107 lb 6.4 oz (48.7 kg)  Height: 5' 1.6" (1.565 m)  PainSc: 0-No pain   Body mass index is 19.9 kg/m.   Objective:  Physical Exam Vitals reviewed.  Constitutional:      General: She is not in acute distress.    Appearance: Normal appearance.  Cardiovascular:     Rate and Rhythm: Normal rate and regular rhythm.     Pulses: Normal pulses.     Heart sounds: Normal heart sounds. No murmur heard. Pulmonary:     Effort: Pulmonary effort is normal. No respiratory distress.     Breath sounds: Normal breath sounds. No wheezing.  Musculoskeletal:        General: No swelling, tenderness, deformity or signs of injury. Normal range of motion.     Right lower leg: No edema.     Left lower leg: No edema.  Skin:  Capillary Refill: Capillary refill takes less than 2 seconds.  Neurological:     General: No focal deficit present.     Mental Status: She is alert and oriented to person, place, and time.     Cranial Nerves: No cranial nerve deficit.     Motor: No weakness.  Psychiatric:        Mood and Affect: Mood normal.        Behavior: Behavior normal.        Thought Content: Thought content normal.        Judgment: Judgment normal.        Assessment And Plan:     1. Pain in both lower extremities Comments: will check autoimmune panel Encouraged to take vitamin supplement - VITAMIN D 25 Hydroxy (Vit-D Deficiency, Fractures) - Vitamin B12 - BMP8+eGFR - Autoimmune  Profile  2. Tingling in extremities Comments: Will check metabolic causes - Hemoglobin A1c  3. COVID-19 vaccination declined Declines covid 19 vaccine. Discussed risk of covid 64 and if she changes her mind about the vaccine to call the office.  Encouraged to take multivitamin, vitamin d, vitamin c and zinc to increase immune system. Aware can call office if would like to have vaccine here at office.    4. Encounter for hepatitis C screening test for low risk patient - Hepatitis C antibody  5. Encounter for HIV (human immunodeficiency virus) test - HIV Antibody (routine testing w rflx)    Patient was given opportunity to ask questions. Patient verbalized understanding of the plan and was able to repeat key elements of the plan. All questions were answered to their satisfaction.  Minette Brine, FNP   I, Minette Brine, FNP, have reviewed all documentation for this visit. The documentation on 10/22/20 for the exam, diagnosis, procedures, and orders are all accurate and complete.   IF YOU HAVE BEEN REFERRED TO A SPECIALIST, IT MAY TAKE 1-2 WEEKS TO SCHEDULE/PROCESS THE REFERRAL. IF YOU HAVE NOT HEARD FROM US/SPECIALIST IN TWO WEEKS, PLEASE GIVE [place] A CALL AT [phone].   THE PATIENT IS ENCOURAGED TO PRACTICE SOCIAL DISTANCING DUE TO THE COVID-19 PANDEMIC.

## 2020-09-24 LAB — BMP8+EGFR
BUN/Creatinine Ratio: 21 (ref 9–23)
BUN: 13 mg/dL (ref 6–20)
CO2: 22 mmol/L (ref 20–29)
Calcium: 10.2 mg/dL (ref 8.7–10.2)
Chloride: 100 mmol/L (ref 96–106)
Creatinine, Ser: 0.61 mg/dL (ref 0.57–1.00)
Glucose: 84 mg/dL (ref 65–99)
Potassium: 4.6 mmol/L (ref 3.5–5.2)
Sodium: 139 mmol/L (ref 134–144)
eGFR: 126 mL/min/{1.73_m2} (ref >59)

## 2020-09-24 LAB — AUTOIMMUNE PROFILE
Anti Nuclear Antibody (ANA): NEGATIVE
Complement C3, Serum: 121 mg/dL (ref 82–167)
dsDNA Ab: <1 IU/mL (ref 0–9)

## 2020-09-24 LAB — VITAMIN D 25 HYDROXY (VIT D DEFICIENCY, FRACTURES): Vit D, 25-Hydroxy: 19.3 ng/mL — ABNORMAL LOW (ref 30.0–100.0)

## 2020-09-24 LAB — HEPATITIS C ANTIBODY: Hep C Virus Ab: 0.1 s/co ratio (ref 0.0–0.9)

## 2020-09-24 LAB — VITAMIN B12: Vitamin B-12: 321 pg/mL (ref 232–1245)

## 2020-09-24 LAB — HIV ANTIBODY (ROUTINE TESTING W REFLEX): HIV Screen 4th Generation wRfx: NONREACTIVE

## 2020-09-24 LAB — HEMOGLOBIN A1C
Est. average glucose Bld gHb Est-mCnc: 108 mg/dL
Hgb A1c MFr Bld: 5.4 % (ref 4.8–5.6)

## 2020-10-22 MED ORDER — VITAMIN D (ERGOCALCIFEROL) 1.25 MG (50000 UNIT) PO CAPS: 50000.0000 [IU] | ORAL_CAPSULE | ORAL | 1 refills | Status: DC

## 2020-11-08 ENCOUNTER — Other Ambulatory Visit: Payer: Self-pay

## 2020-11-08 ENCOUNTER — Encounter (HOSPITAL_COMMUNITY): Payer: Self-pay

## 2020-11-08 ENCOUNTER — Ambulatory Visit (HOSPITAL_COMMUNITY)
Admission: EM | Admit: 2020-11-08 | Discharge: 2020-11-08 | Disposition: A | Discharge status: Home / Self Care | Payer: BC Managed Care – PPO | Attending: Student | Admitting: Student

## 2020-11-08 DIAGNOSIS — J029 Acute pharyngitis, unspecified: Secondary | ICD-10-CM | POA: Insufficient documentation

## 2020-11-08 DIAGNOSIS — Z8616 Personal history of COVID-19: Secondary | ICD-10-CM | POA: Insufficient documentation

## 2020-11-08 DIAGNOSIS — Z112 Encounter for screening for other bacterial diseases: Secondary | ICD-10-CM | POA: Insufficient documentation

## 2020-11-08 LAB — POCT RAPID STREP A, ED / UC: Streptococcus, Group A Screen (Direct): NEGATIVE

## 2020-11-08 MED ORDER — PROMETHAZINE-DM 6.25-15 MG/5ML PO SYRP: 5.0000 mL | ORAL_SOLUTION | Freq: Four times a day (QID) | ORAL | 0 refills | Status: DC | PRN

## 2020-11-08 MED ORDER — LIDOCAINE VISCOUS HCL 2 % MT SOLN: 15.0000 mL | OROMUCOSAL | 0 refills | Status: DC | PRN

## 2020-11-08 NOTE — Discharge Instructions (Addendum)
-  Promethazine DM cough syrup for congestion/cough. This could make you drowsy, so take at night before bed. -For sore throat, use lidocaine mouthwash up to every 4 hours. Make sure not to eat for at least 1 hour after using this, as your mouth will be very numb and you could bite yourself. -For fevers/chills, bodyaches, headaches- -You can take Tylenol up to 1000 mg 3 times daily, and ibuprofen up to 800 mg 3 times daily with food.  You can take these together, or alternate every 3-4 hours. -With a virus, you're typically contagious for 5-7 days, or as long as you're having fevers.

## 2020-11-08 NOTE — ED Triage Notes (Signed)
Pt presents with a sore throat, swollen tonsils x 3 days.   States she has been taking halls and cough drops.

## 2020-11-08 NOTE — ED Provider Notes (Signed)
[facility]    CSN: [id number] Arrival date & time: 11/08/20  1538      History   Chief Complaint Chief Complaint  Patient presents with   Sore Throat    HPI Tiffany Ball is a 30 y.o. female presenting with sore throat and swollen tonsils x3 days. Medical history noncontributory. Nonproductive cough, worse at night. Cough drops and warm tea providing little relief. Denies fevers/chills, n/v/d, shortness of breath, chest pain, facial pain, teeth pain, headaches, loss of taste/smell, swollen lymph nodes, ear pain.    HPI  Past Medical History:  Diagnosis Date   Hypercholesteremia    Vitamin D deficiency     There are no problems to display for this patient.   Past Surgical History:  Procedure Laterality Date   knee cyst  2008   KNEE SURGERY     right   WISDOM TOOTH EXTRACTION      OB History     Gravida  1   Para  0   Term  0   Preterm  0   AB  0   Living         SAB  0   IAB  0   Ectopic  0   Multiple      Live Births               Home Medications    Prior to Admission medications   Medication Sig Start Date End Date Taking? Authorizing Provider  lidocaine (XYLOCAINE) 2 % solution Use as directed 15 mLs in the mouth or throat as needed for mouth pain. 11/08/20  Yes Rhys Martini, PA-C  promethazine-dextromethorphan (PROMETHAZINE-DM) 6.25-15 MG/5ML syrup Take 5 mLs by mouth 4 (four) times daily as needed for cough. 11/08/20  Yes Rhys Martini, PA-C  Vitamin D, Ergocalciferol, (DRISDOL) 1.25 MG (50000 UNIT) CAPS capsule Take 1 capsule (50,000 Units total) by mouth every 7 (seven) days. 10/22/20   Arnette Felts, FNP    Family History History reviewed. No pertinent family history.  Social History Social History   Tobacco Use   Smoking status: Never   Smokeless tobacco: Never  Vaping Use   Vaping Use: Never used  Substance Use Topics   Alcohol use: Yes    Comment: social   Drug use: No     Allergies   Plan  b [levonorgestrel]   Review of Systems Review of Systems  Constitutional:  Negative for appetite change, chills and fever.  HENT:  Positive for sore throat. Negative for congestion, ear pain, rhinorrhea, sinus pressure, sinus pain, trouble swallowing and voice change.   Eyes:  Negative for redness and visual disturbance.  Respiratory:  Negative for cough, chest tightness, shortness of breath and wheezing.   Cardiovascular:  Negative for chest pain and palpitations.  Gastrointestinal:  Negative for abdominal pain, constipation, diarrhea, nausea and vomiting.  Genitourinary:  Negative for dysuria, frequency and urgency.  Musculoskeletal:  Negative for myalgias.  Neurological:  Negative for dizziness, weakness and headaches.  Psychiatric/Behavioral:  Negative for confusion.   All other systems reviewed and are negative.   Physical Exam Triage Vital Signs ED Triage Vitals  Enc Vitals Group     BP 11/08/20 1551 117/78     Pulse Rate 11/08/20 1551 94     Resp 11/08/20 1551 19     Temp 11/08/20 1551 98.9 F (37.2 C)     Temp Source 11/08/20 1551 Oral     SpO2 11/08/20 1551 96 %  Weight --      Height --      Head Circumference --      Peak Flow --      Pain Score 11/08/20 1550 0     Pain Loc --      Pain Edu? --      Excl. in GC? --    No data found.  Updated Vital Signs BP 117/78 (BP Location: Right Arm)   Pulse 94   Temp 98.9 F (37.2 C) (Oral)   Resp 19   LMP 10/09/2020 (Exact Date)   SpO2 96%   Visual Acuity Right Eye Distance:   Left Eye Distance:   Bilateral Distance:    Right Eye Near:   Left Eye Near:    Bilateral Near:     Physical Exam Vitals reviewed.  Constitutional:      General: She is not in acute distress.    Appearance: Normal appearance. She is not ill-appearing.  HENT:     Head: Normocephalic and atraumatic.     Right Ear: Tympanic membrane, ear canal and external ear normal. No tenderness. No middle ear effusion. There is no impacted  cerumen. Tympanic membrane is not perforated, erythematous, retracted or bulging.     Left Ear: Tympanic membrane, ear canal and external ear normal. No tenderness.  No middle ear effusion. There is no impacted cerumen. Tympanic membrane is not perforated, erythematous, retracted or bulging.     Nose: Nose normal. No congestion.     Mouth/Throat:     Mouth: Mucous membranes are moist.     Pharynx: Uvula midline. Posterior oropharyngeal erythema present. No oropharyngeal exudate.     Tonsils: No tonsillar exudate. 1+ on the right. 1+ on the left.     Comments: Smooth erythema posterior pharynx On exam, uvula is midline, she is tolerating her secretions without difficulty, there is no trismus, no drooling, she has normal phonation  Eyes:     Extraocular Movements: Extraocular movements intact.     Pupils: Pupils are equal, round, and reactive to light.  Cardiovascular:     Rate and Rhythm: Normal rate and regular rhythm.     Heart sounds: Normal heart sounds.  Pulmonary:     Effort: Pulmonary effort is normal.     Breath sounds: Normal breath sounds. No decreased breath sounds, wheezing, rhonchi or rales.  Abdominal:     Palpations: Abdomen is soft.     Tenderness: There is no abdominal tenderness. There is no guarding or rebound.  Lymphadenopathy:     Cervical: Cervical adenopathy present.     Right cervical: Superficial cervical adenopathy present.     Left cervical: Superficial cervical adenopathy present.  Neurological:     General: No focal deficit present.     Mental Status: She is alert and oriented to person, place, and time.  Psychiatric:        Mood and Affect: Mood normal.        Behavior: Behavior normal.        Thought Content: Thought content normal.        Judgment: Judgment normal.     UC Treatments / Results  Labs (all labs ordered are listed, but only abnormal results are displayed) Labs Reviewed  POCT RAPID STREP A, ED / UC    EKG   Radiology No  results found.  Procedures Procedures (including critical care time)  Medications Ordered in UC Medications - No data to display  Initial Impression / Assessment and Plan /  UC Course  I have reviewed the triage vital signs and the nursing notes.  Pertinent labs & imaging results that were available during my care of the patient were reviewed by me and considered in my medical decision making (see chart for details).     This patient is a very pleasant 30 y.o. year old female presenting with viral pharyngitis. Today this pt is afebrile nontachycardic nontachypneic, oxygenating well on room air, no wheezes rhonchi or rales. States not pregnant or breastfeeding. Viscous lidocaine, promethazine DM. Rapid strep negative, culture sent Covid-19 3 weeks ago so did not send test today. ED return precautions discussed. Patient verbalizes understanding and agreement.    Final Clinical Impressions(s) / UC Diagnoses   Final diagnoses:  Viral pharyngitis  History of COVID-19  Screening for streptococcal infection     Discharge Instructions      -Promethazine DM cough syrup for congestion/cough. This could make you drowsy, so take at night before bed. -For sore throat, use lidocaine mouthwash up to every 4 hours. Make sure not to eat for at least 1 hour after using this, as your mouth will be very numb and you could bite yourself. -For fevers/chills, bodyaches, headaches- -You can take Tylenol up to 1000 mg 3 times daily, and ibuprofen up to 800 mg 3 times daily with food.  You can take these together, or alternate every 3-4 hours. -With a virus, you're typically contagious for 5-7 days, or as long as you're having fevers.        ED Prescriptions     Medication Sig Dispense Auth. Provider   promethazine-dextromethorphan (PROMETHAZINE-DM) 6.25-15 MG/5ML syrup Take 5 mLs by mouth 4 (four) times daily as needed for cough. 118 mL Ignacia Bayley E, PA-C   lidocaine (XYLOCAINE) 2 % solution Use  as directed 15 mLs in the mouth or throat as needed for mouth pain. 100 mL Rhys Martini, PA-C      PDMP not reviewed this encounter.   Rhys Martini, PA-C 11/08/20 1623

## 2020-11-11 LAB — CULTURE, GROUP A STREP (THRC)

## 2020-12-16 ENCOUNTER — Other Ambulatory Visit: Payer: Self-pay | Admitting: Nurse Practitioner

## 2020-12-16 MED ORDER — ALBUTEROL SULFATE HFA 108 (90 BASE) MCG/ACT IN AERS: 1.0000 | INHALATION_SPRAY | Freq: Four times a day (QID) | RESPIRATORY_TRACT | 0 refills | Status: DC | PRN

## 2021-01-07 ENCOUNTER — Encounter: Payer: BC Managed Care – PPO | Admitting: Nurse Practitioner

## 2021-01-07 NOTE — Progress Notes (Deleted)
This visit occurred during the SARS-CoV-2 public health emergency.  Safety protocols were in place, including screening questions prior to the visit, additional usage of staff PPE, and extensive cleaning of exam room while observing appropriate contact time as indicated for disinfecting solutions.  Subjective:     Patient ID: Tiffany Ball , female    DOB: 1990-11-20 , 30 y.o.   MRN: [id number]   Chief Complaint  Patient presents with   Annual Exam    HPI  Patient here for hm.    Past Medical History:  Diagnosis Date   Hypercholesteremia    Vitamin D deficiency      No family history on file.   Current Outpatient Medications:    albuterol (VENTOLIN HFA) 108 (90 Base) MCG/ACT inhaler, Inhale 1-2 puffs into the lungs every 6 (six) hours as needed for wheezing or shortness of breath., Disp: 18 g, Rfl: 0   lidocaine (XYLOCAINE) 2 % solution, Use as directed 15 mLs in the mouth or throat as needed for mouth pain., Disp: 100 mL, Rfl: 0   promethazine-dextromethorphan (PROMETHAZINE-DM) 6.25-15 MG/5ML syrup, Take 5 mLs by mouth 4 (four) times daily as needed for cough., Disp: 118 mL, Rfl: 0   Vitamin D, Ergocalciferol, (DRISDOL) 1.25 MG (50000 UNIT) CAPS capsule, Take 1 capsule (50,000 Units total) by mouth every 7 (seven) days., Disp: 12 capsule, Rfl: 1   Allergies  Allergen Reactions   Plan B [Levonorgestrel] Anaphylaxis    Was eating peanuts when took this so not sure if it was plan b or peanuts.      The patient states she uses {contraceptive methods:5051} for birth control. Last LMP was No LMP recorded. (Menstrual status: Irregular Periods).. {Dysmenorrhea-menorrhagia:21918}. Negative for: breast discharge, breast lump(s), breast pain and breast self exam. Associated symptoms include abnormal vaginal bleeding. Pertinent negatives include abnormal bleeding (hematology), anxiety, decreased libido, depression, difficulty falling sleep, dyspareunia, history of infertility,  nocturia, sexual dysfunction, sleep disturbances, urinary incontinence, urinary urgency, vaginal discharge and vaginal itching. Diet regular.The patient states her exercise level is    . The patient's tobacco use is:  Social History   Tobacco Use  Smoking Status Never  Smokeless Tobacco Never  . She has been exposed to passive smoke. The patient's alcohol use is:  Social History   Substance and Sexual Activity  Alcohol Use Yes   Comment: social  . Additional information: Last pap ***, next one scheduled for ***.    Review of Systems  Constitutional: Negative.   HENT: Negative.    Eyes: Negative.   Respiratory: Negative.    Cardiovascular: Negative.   Gastrointestinal: Negative.   Endocrine: Negative.   Genitourinary: Negative.   Musculoskeletal: Negative.   Skin: Negative.   Allergic/Immunologic: Negative.   Neurological: Negative.   Hematological: Negative.   Psychiatric/Behavioral: Negative.      There were no vitals filed for this visit. There is no height or weight on file to calculate BMI.   Objective:  Physical Exam      Assessment And Plan:     1. Encounter for general adult medical examination w/o abnormal findings  2. Vitamin D deficiency     Patient was given opportunity to ask questions. Patient verbalized understanding of the plan and was able to repeat key elements of the plan. All questions were answered to their satisfaction.   Patrecia Pour [place], CMA   I, Patrecia Pour [place], CMA, have reviewed all documentation for this visit. The documentation on 01/07/21 for the exam,  diagnosis, procedures, and orders are all accurate and complete.  THE PATIENT IS ENCOURAGED TO PRACTICE SOCIAL DISTANCING DUE TO THE COVID-19 PANDEMIC.

## 2021-01-09 NOTE — Progress Notes (Signed)
No Show

## 2021-02-13 ENCOUNTER — Other Ambulatory Visit: Payer: Self-pay

## 2021-02-13 ENCOUNTER — Encounter (HOSPITAL_COMMUNITY): Payer: Self-pay

## 2021-02-13 ENCOUNTER — Emergency Department (HOSPITAL_COMMUNITY)
Admission: EM | Admit: 2021-02-13 | Discharge: 2021-02-13 | Disposition: A | Discharge status: Home / Self Care | Payer: No Typology Code available for payment source | Attending: Emergency Medicine | Admitting: Emergency Medicine

## 2021-02-13 ENCOUNTER — Emergency Department (HOSPITAL_COMMUNITY): Payer: No Typology Code available for payment source

## 2021-02-13 DIAGNOSIS — Y92410 Unspecified street and highway as the place of occurrence of the external cause: Secondary | ICD-10-CM | POA: Diagnosis not present

## 2021-02-13 DIAGNOSIS — S060X0A Concussion without loss of consciousness, initial encounter: Secondary | ICD-10-CM | POA: Diagnosis not present

## 2021-02-13 DIAGNOSIS — M542 Cervicalgia: Secondary | ICD-10-CM | POA: Insufficient documentation

## 2021-02-13 DIAGNOSIS — H53149 Visual discomfort, unspecified: Secondary | ICD-10-CM | POA: Insufficient documentation

## 2021-02-13 DIAGNOSIS — S0990XA Unspecified injury of head, initial encounter: Secondary | ICD-10-CM | POA: Diagnosis present

## 2021-02-13 LAB — I-STAT BETA HCG BLOOD, ED (MC, WL, AP ONLY): I-stat hCG, quantitative: <5 m[IU]/mL (ref <5)

## 2021-02-13 MED ORDER — SODIUM CHLORIDE 0.9 % IV SOLN: INTRAVENOUS | Status: DC

## 2021-02-13 MED ORDER — SODIUM CHLORIDE 0.9 % IV BOLUS
1000.0000 mL | Freq: Once | INTRAVENOUS | Status: AC
  Administered 2021-02-13: 1000 mL via INTRAVENOUS

## 2021-02-13 MED ORDER — DIPHENHYDRAMINE HCL 50 MG/ML IJ SOLN
12.5000 mg | Freq: Once | INTRAMUSCULAR | Status: AC
  Administered 2021-02-13: 12.5 mg via INTRAVENOUS
  Filled 2021-02-13: qty 1

## 2021-02-13 MED ORDER — ACETAMINOPHEN 500 MG PO TABS
1000.0000 mg | ORAL_TABLET | Freq: Once | ORAL | Status: AC
  Administered 2021-02-13: 1000 mg via ORAL
  Filled 2021-02-13: qty 2

## 2021-02-13 MED ORDER — METOCLOPRAMIDE HCL 5 MG/ML IJ SOLN
10.0000 mg | Freq: Once | INTRAMUSCULAR | Status: AC
  Administered 2021-02-13: 10 mg via INTRAVENOUS
  Filled 2021-02-13: qty 2
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
## 2021-02-16 ENCOUNTER — Other Ambulatory Visit: Payer: Self-pay

## 2021-02-16 ENCOUNTER — Telehealth: Payer: Self-pay

## 2021-02-16 DIAGNOSIS — E559 Vitamin D deficiency, unspecified: Secondary | ICD-10-CM

## 2021-02-16 MED ORDER — VITAMIN D (ERGOCALCIFEROL) 1.25 MG (50000 UNIT) PO CAPS: 50000.0000 [IU] | ORAL_CAPSULE | ORAL | 1 refills | Status: DC

## 2021-02-16 NOTE — Telephone Encounter (Signed)
Spoke with patient after being admitted and discharged on 02/13/2021. She stated she is feeling better, more movement today than before & also drove herself to work. She denied needing an appointment since her discharge being she does feel better. Patient kindly urged if any questions or concerns feel free to give the office a call to schedule an appointment.

## 2021-12-18 DIAGNOSIS — M542 Cervicalgia: Secondary | ICD-10-CM | POA: Insufficient documentation

## 2022-05-06 ENCOUNTER — Telehealth: Payer: Self-pay

## 2022-05-06 ENCOUNTER — Emergency Department (HOSPITAL_COMMUNITY)
Admission: EM | Admit: 2022-05-06 | Discharge: 2022-05-06 | Disposition: A | Discharge status: Home / Self Care | Payer: BLUE CROSS/BLUE SHIELD | Attending: Emergency Medicine | Admitting: Emergency Medicine

## 2022-05-06 ENCOUNTER — Encounter (HOSPITAL_COMMUNITY): Payer: Self-pay | Admitting: Emergency Medicine

## 2022-05-06 ENCOUNTER — Other Ambulatory Visit: Payer: Self-pay

## 2022-05-06 DIAGNOSIS — K047 Periapical abscess without sinus: Secondary | ICD-10-CM | POA: Insufficient documentation

## 2022-05-06 DIAGNOSIS — K0889 Other specified disorders of teeth and supporting structures: Secondary | ICD-10-CM | POA: Diagnosis present

## 2022-05-06 MED ORDER — AMOXICILLIN-POT CLAVULANATE 875-125 MG PO TABS: 1.0000 | ORAL_TABLET | Freq: Two times a day (BID) | ORAL | 0 refills | Status: DC

## 2022-05-06 MED ORDER — OXYCODONE-ACETAMINOPHEN 5-325 MG PO TABS
1.0000 | ORAL_TABLET | Freq: Once | ORAL | Status: AC
  Administered 2022-05-06: 1 via ORAL
  Filled 2022-05-06: qty 1

## 2022-05-06 MED ORDER — ONDANSETRON 4 MG PO TBDP
4.0000 mg | ORAL_TABLET | Freq: Once | ORAL | Status: AC
  Administered 2022-05-06: 4 mg via ORAL
  Filled 2022-05-06: qty 1

## 2022-05-06 MED ORDER — AMOXICILLIN-POT CLAVULANATE 875-125 MG PO TABS
1.0000 | ORAL_TABLET | Freq: Once | ORAL | Status: AC
  Administered 2022-05-06: 1 via ORAL
  Filled 2022-05-06: qty 1

## 2022-05-06 NOTE — ED Provider Notes (Signed)
[facility] Provider Note   CSN: [id number] Arrival date & time: 05/06/22  2440     History  Chief Complaint  Patient presents with   Dental Problem    Tiffany Ball is a 32 y.o. female who presents with left upper dental pain that woke her from her sleep 2 hours ago.  Pain and swelling to the gums in the left upper mouth, history of similar problems in the past.  Advil at home but vomited up secondary to severity of pain.  Patient tearful.  Pain radiates to the ear.  No fevers chills nausea vomiting difficulty swallowing.  I have reviewed her medical record.  History of hypercholesterolemia and vitamin D deficiency.  Marijuana use a few hours ago, denies alcohol use or other recreational drug use.   HPI     Home Medications Prior to Admission medications   Medication Sig Start Date End Date Taking? Authorizing Provider  amoxicillin-clavulanate (AUGMENTIN) 875-125 MG tablet Take 1 tablet by mouth every 12 (twelve) hours. 05/06/22  Yes Torez Beauregard R, PA-C  albuterol (VENTOLIN HFA) 108 (90 Base) MCG/ACT inhaler Inhale 1-2 puffs into the lungs every 6 (six) hours as needed for wheezing or shortness of breath. 12/16/20   Minette Brine, FNP  lidocaine (XYLOCAINE) 2 % solution Use as directed 15 mLs in the mouth or throat as needed for mouth pain. 11/08/20   Hazel Sams, PA-C  promethazine-dextromethorphan (PROMETHAZINE-DM) 6.25-15 MG/5ML syrup Take 5 mLs by mouth 4 (four) times daily as needed for cough. 11/08/20   Hazel Sams, PA-C  Vitamin D, Ergocalciferol, (DRISDOL) 1.25 MG (50000 UNIT) CAPS capsule Take 1 capsule (50,000 Units total) by mouth every 7 (seven) days. 02/16/21   Minette Brine, FNP      Allergies    Plan b [levonorgestrel]    Review of Systems   Review of Systems  HENT:  Positive for dental problem.     Physical Exam Updated Vital Signs BP (!) 146/91 (BP Location: Right Arm)   Pulse 100   Temp 99 F  (37.2 C) (Oral)   Resp 20   Wt 49.4 kg   LMP 04/01/2022 (Approximate)   SpO2 100%   BMI 20.20 kg/m  Physical Exam Vitals and nursing note reviewed.  Constitutional:      Appearance: She is not ill-appearing or toxic-appearing.  HENT:     Head: Normocephalic and atraumatic.     Mouth/Throat:     Dentition: Dental tenderness, gingival swelling and dental caries present.     Tongue: No lesions. Tongue does not deviate from midline.     Pharynx: Oropharynx is clear. Uvula midline.      Comments: No uvular or lingual deviation.  Normal phonation.  No sublingual or submental tenderness to palpation. Eyes:     General: No scleral icterus.       Right eye: No discharge.        Left eye: No discharge.     Conjunctiva/sclera: Conjunctivae normal.  Neck:     Trachea: Trachea and phonation normal.  Cardiovascular:     Rate and Rhythm: Normal rate and regular rhythm.     Heart sounds: Normal heart sounds. No murmur heard. Pulmonary:     Effort: Pulmonary effort is normal.  Musculoskeletal:     Cervical back: Normal range of motion.  Lymphadenopathy:     Cervical: No cervical adenopathy.  Skin:    General: Skin is warm and dry.  Neurological:  General: No focal deficit present.     Mental Status: She is alert.  Psychiatric:        Mood and Affect: Mood normal.     ED Results / Procedures / Treatments   Labs (all labs ordered are listed, but only abnormal results are displayed) Labs Reviewed - No data to display  EKG None  Radiology No results found.  Procedures Procedures    Medications Ordered in ED Medications  oxyCODONE-acetaminophen (PERCOCET/ROXICET) 5-325 MG per tablet 1 tablet (has no administration in time range)  amoxicillin-clavulanate (AUGMENTIN) 875-125 MG per tablet 1 tablet (has no administration in time range)  ondansetron (ZOFRAN-ODT) disintegrating tablet 4 mg (has no administration in time range)    ED Course/ Medical Decision Making/ A&P                              Medical Decision Making 32 year old female who presents with concern for dental pain that started this morning.  Hypertensive on intake, likely contributed to by pain.  Oropharyngeal exam as above.  No findings to suggest Ludwick's angina though there is evidence of periapical infection without abscess on physical exam.  Risk Prescription drug management.   First dose of antibiotics administered in ED.  Will discharge with antibiotics as well.  Recommend outpatient follow-up with dentist, dental resources provided.  Clinical concern for emergent underlying etiology that warrant further ED workup or inpatient management is exceedingly low.  Launi  voiced understanding of her medical evaluation and treatment plan. Each of their questions answered to their expressed satisfaction.  Return precautions were given.  Patient is well-appearing, stable, and was discharged in good condition.  This chart was dictated using voice recognition software, Dragon. Despite the best efforts of this provider to proofread and correct errors, errors may still occur which can change documentation meaning.   Final Clinical Impression(s) / ED Diagnoses Final diagnoses:  Dental infection    Rx / DC Orders ED Discharge Orders          Ordered    amoxicillin-clavulanate (AUGMENTIN) 875-125 MG tablet  Every 12 hours        05/06/22 0618              Shenetta Schnackenberg, Gypsy Balsam, PA-C 05/06/22 9509    Quintella Reichert, MD 05/06/22 [phone]

## 2022-05-06 NOTE — Telephone Encounter (Signed)
She can try to call them back to see if they will call a prescription in for her or can take over the counter monistat. She has not been seen in the office since 2022

## 2022-05-06 NOTE — ED Triage Notes (Signed)
Pt presents for dental pain that started 2 hr ago. Notes pain and swelling to L upper mouth. No injury or previously know tooth problem in this area.  Tried advil at home but vomited this up, oragel not effective.

## 2022-05-06 NOTE — Discharge Instructions (Addendum)
Please take entire course of antibiotics as directed.  Continue using ibuprofen / Tylenol, as well as prescribed Orajel for pain.  You will need to follow-up with your dentist for continued management of this. Please see dental resources below. Return to the emergency department for fevers, swelling or pain under the tongue or in the neck, difficulty breathing or swallowing, nausea or vomiting that does not stop, or any other new or concerning symptoms.   

## 2022-05-06 NOTE — Telephone Encounter (Signed)
Patient called to state was seen in ED today for tooth infection. Was given atbx. Is now asking for Diflucan to prevent yeast infection.   Please advise, thanks!

## 2022-05-07 NOTE — Telephone Encounter (Signed)
Spoke with patient and advised. Recommended E-Visit or VV w/[facility] via MyChat, as there are no upcoming OV available at this time. Patient will try this. Nothing further needed at this time.

## 2022-06-02 ENCOUNTER — Ambulatory Visit (INDEPENDENT_AMBULATORY_CARE_PROVIDER_SITE_OTHER): Payer: BLUE CROSS/BLUE SHIELD | Admitting: Nurse Practitioner

## 2022-06-02 ENCOUNTER — Encounter: Payer: Self-pay | Admitting: Nurse Practitioner

## 2022-06-02 VITALS — BP 104/62 | HR 81 | Temp 98.2°F | Ht 61.5 in | Wt 115.0 lb

## 2022-06-02 DIAGNOSIS — R21 Rash and other nonspecific skin eruption: Secondary | ICD-10-CM | POA: Insufficient documentation

## 2022-06-02 DIAGNOSIS — F419 Anxiety disorder, unspecified: Secondary | ICD-10-CM

## 2022-06-02 DIAGNOSIS — G4709 Other insomnia: Secondary | ICD-10-CM

## 2022-06-02 DIAGNOSIS — F32A Depression, unspecified: Secondary | ICD-10-CM

## 2022-06-02 DIAGNOSIS — R630 Anorexia: Secondary | ICD-10-CM

## 2022-06-02 DIAGNOSIS — L2489 Irritant contact dermatitis due to other agents: Secondary | ICD-10-CM | POA: Diagnosis not present

## 2022-06-02 DIAGNOSIS — G4452 New daily persistent headache (NDPH): Secondary | ICD-10-CM | POA: Insufficient documentation

## 2022-06-02 DIAGNOSIS — E559 Vitamin D deficiency, unspecified: Secondary | ICD-10-CM | POA: Insufficient documentation

## 2022-06-02 DIAGNOSIS — Z23 Encounter for immunization: Secondary | ICD-10-CM

## 2022-06-02 MED ORDER — MELATONIN 3 MG PO TABS: 3.0000 mg | ORAL_TABLET | Freq: Every day | ORAL | 1 refills | Status: DC

## 2022-06-02 MED ORDER — SUMATRIPTAN SUCCINATE 50 MG PO TABS: 50.0000 mg | ORAL_TABLET | Freq: Once | ORAL | 2 refills | Status: DC | PRN

## 2022-06-02 MED ORDER — TRIAMCINOLONE ACETONIDE 0.5 % EX OINT: 1.0000 | TOPICAL_OINTMENT | Freq: Two times a day (BID) | CUTANEOUS | 0 refills | Status: DC

## 2022-06-02 NOTE — Progress Notes (Signed)
I,Sheena H Holbrook,acting as a Education administrator for Minette Brine, FNP.,have documented all relevant documentation on the behalf of Minette Brine, FNP,as directed by  Minette Brine, FNP while in the presence of Minette Brine, [place].    Subjective:     Patient ID: Tiffany Ball , female    DOB: [birth year] , 32 y.o.   MRN: [id number]   Chief Complaint  Patient presents with   Headache    HPI  Patient presents today for headaches and rash. Patient reports getting headaches every other day, she has never been diagnosed or treated for migraines. Patient reports pain/pressure across her eyes and forehead, she does take Excedrin Migraine and this has not been helping. She has not noticed any triggers for the headaches. She has been missing class due to the headaches. She has photophobia. When this occurs she is "down for a few days". The most recent has been for 3 weeks straight. She has been taking excedrin migraine one a day. She will wake up with a headache.   She is not sleeping at night. She used to be able to go to sleep. She thinks may have started after having covid of 2020. She does smoke marijuana to help with going to sleep. She has tried benadryl but then she is groggy. She has not tried melatonin for her sleep. Does not have a headache currently.  She would like referral to Neuro.  She had purchased shoes from [employer] and was there 2 summers ago and when she had a tattoo placed but seem to be getting worse.   Patient also reports rash and skin discoloration to her both her feet after wearing sandals. She states the areas itch often and are dry. She has applied OTC hydrocortisone cream with no relief.   She has a Transport planner on [place]. She feels good when she is talking to the therapist.     Headache  This is a chronic problem. Associated symptoms include photophobia. Pertinent negatives include no dizziness, nausea, numbness, visual change or vomiting. The symptoms are aggravated by fatigue. She  has tried nothing for the symptoms.     Past Medical History:  Diagnosis Date   Hypercholesteremia    Vitamin D deficiency      History reviewed. No pertinent family history.   Current Outpatient Medications:    albuterol (VENTOLIN HFA) 108 (90 Base) MCG/ACT inhaler, Inhale 1-2 puffs into the lungs every 6 (six) hours as needed for wheezing or shortness of breath., Disp: 18 g, Rfl: 0   melatonin 3 MG TABS tablet, Take 1 tablet (3 mg total) by mouth at bedtime., Disp: 90 tablet, Rfl: 1   SUMAtriptan (IMITREX) 50 MG tablet, Take 1 tablet (50 mg total) by mouth once as needed for migraine. May repeat in 2 hours if headache persists or recurs., Disp: 30 tablet, Rfl: 2   triamcinolone ointment (KENALOG) 0.5 %, Apply 1 Application topically 2 (two) times daily., Disp: 30 g, Rfl: 0   Vitamin D, Ergocalciferol, (DRISDOL) 1.25 MG (50000 UNIT) CAPS capsule, Take 1 capsule (50,000 Units total) by mouth every 7 (seven) days. (Patient not taking: Reported on 06/02/2022), Disp: 12 capsule, Rfl: 1   Allergies  Allergen Reactions   Plan B [Levonorgestrel] Anaphylaxis    Was eating peanuts when took this so not sure if it was plan b or peanuts.     Review of Systems  Constitutional:  Positive for appetite change (decreased appetite).  Eyes:  Positive for photophobia.  Respiratory: Negative.  Cardiovascular: Negative.   Gastrointestinal:  Negative for nausea and vomiting.  Skin:  Positive for color change and rash (hyperpigmented skin to anterior feet).  Neurological:  Positive for headaches. Negative for dizziness and numbness.  Psychiatric/Behavioral: Negative.    All other systems reviewed and are negative.    Today's Vitals   06/02/22 0923  BP: 104/62  Pulse: 81  Temp: 98.2 F (36.8 C)  TempSrc: Oral  SpO2: 97%  Weight: 115 lb (52.2 kg)  Height: 5' 1.5" (1.562 m)   Body mass index is 21.38 kg/m.   Objective:  Physical Exam Vitals reviewed.  Constitutional:      General: She  is not in acute distress.    Appearance: Normal appearance. She is well-developed.  Cardiovascular:     Rate and Rhythm: Normal rate and regular rhythm.     Pulses: Normal pulses.     Heart sounds: Normal heart sounds. No murmur heard. Pulmonary:     Effort: Pulmonary effort is normal. No respiratory distress.     Breath sounds: Normal breath sounds. No wheezing.  Musculoskeletal:        General: No swelling, tenderness, deformity or signs of injury. Normal range of motion.     Right lower leg: No edema.     Left lower leg: No edema.  Skin:    General: Skin is warm.     Capillary Refill: Capillary refill takes less than 2 seconds.     Findings: Rash (hyperpigmentation with dry scaly skin to left anterior foot and hyperpigmented skin on right) present.  Neurological:     General: No focal deficit present.     Mental Status: She is alert and oriented to person, place, and time.     Cranial Nerves: No cranial nerve deficit.     Motor: No weakness.  Psychiatric:        Mood and Affect: Mood normal. Mood is not anxious or depressed.        Behavior: Behavior normal.        Thought Content: Thought content normal.        Judgment: Judgment normal.         Assessment And Plan:     1. New daily persistent headache Comments: may be related to sleep deprivation - Hemoglobin A1c - Ambulatory referral to Neurology - SUMAtriptan (IMITREX) 50 MG tablet; Take 1 tablet (50 mg total) by mouth once as needed for migraine. May repeat in 2 hours if headache persists or recurs.  Dispense: 30 tablet; Refill: 2 - Ambulatory referral to Psychiatry  2. Irritant contact dermatitis due to other agents Comments: Dry hyperpigmented skin to anterior left foot with some slightly dry skin and also has hyperpigmented skin to right foot, will treat with steroid cream - triamcinolone ointment (KENALOG) 0.5 %; Apply 1 Application topically 2 (two) times daily.  Dispense: 30 g; Refill: 0  3. Decreased  appetite Comments: Will check for metabolic cause vs depression - Hemoglobin A1c - BMP8+eGFR - CBC - TSH  4. Other insomnia Comments: She is to try melatonin OTC and magnesium supplement. - melatonin 3 MG TABS tablet; Take 1 tablet (3 mg total) by mouth at bedtime.  Dispense: 90 tablet; Refill: 1  5. Mild depression Comments: Depression screen score is 7, will refer to psychiatry she is already going to see a therapist who was going to refer to psych - Ambulatory referral to Psychiatry  6. Anxiety Comments: score of 5 with GAD score, will refer to psychiatry -  Ambulatory referral to Psychiatry  7. Need for Tdap vaccination Will give tetanus vaccine today while in office. Refer to order management. TDAP will be administered to adults 55-88 years old every 10 years. - Tdap vaccine greater than or equal to 7yo IM     Patient was given opportunity to ask questions. Patient verbalized understanding of the plan and was able to repeat key elements of the plan. All questions were answered to their satisfaction.  Minette Brine, FNP   I, Minette Brine, FNP, have reviewed all documentation for this visit. The documentation on 06/02/22 for the exam, diagnosis, procedures, and orders are all accurate and complete.   IF YOU HAVE BEEN REFERRED TO A SPECIALIST, IT MAY TAKE 1-2 WEEKS TO SCHEDULE/PROCESS THE REFERRAL. IF YOU HAVE NOT HEARD FROM US/SPECIALIST IN TWO WEEKS, PLEASE GIVE [place] A CALL AT [phone].   THE PATIENT IS ENCOURAGED TO PRACTICE SOCIAL DISTANCING DUE TO THE COVID-19 PANDEMIC.

## 2022-06-22 ENCOUNTER — Telehealth: Payer: BLUE CROSS/BLUE SHIELD | Admitting: Nurse Practitioner

## 2022-06-22 DIAGNOSIS — K047 Periapical abscess without sinus: Secondary | ICD-10-CM | POA: Diagnosis not present

## 2022-06-22 MED ORDER — AMOXICILLIN-POT CLAVULANATE 875-125 MG PO TABS: 1.0000 | ORAL_TABLET | Freq: Two times a day (BID) | ORAL | 0 refills | Status: DC

## 2022-06-22 MED ORDER — NAPROXEN 500 MG PO TABS: 500.0000 mg | ORAL_TABLET | Freq: Two times a day (BID) | ORAL | 0 refills | Status: AC

## 2022-06-22 NOTE — Patient Instructions (Addendum)
    Try [employer] [facility]: Network    [email or web address]  [facility]  [email or web address]    Or you can try: You can call [phone] Dentist to find a dentist

## 2022-06-22 NOTE — Progress Notes (Signed)
Virtual Visit Consent   [name], you are scheduled for a virtual visit with a [facility] provider today. Just as with appointments in the office, your consent must be obtained to participate. Your consent will be active for this visit and any virtual visit you may have with one of our providers in the next 365 days. If you have a MyChart account, a copy of this consent can be sent to you electronically.  As this is a virtual visit, video technology does not allow for your provider to perform a traditional examination. This may limit your provider's ability to fully assess your condition. If your provider identifies any concerns that need to be evaluated in person or the need to arrange testing (such as labs, EKG, etc.), we will make arrangements to do so. Although advances in technology are sophisticated, we cannot ensure that it will always work on either your end or our end. If the connection with a video visit is poor, the visit may have to be switched to a telephone visit. With either a video or telephone visit, we are not always able to ensure that we have a secure connection.  By engaging in this virtual visit, you consent to the provision of healthcare and authorize for your insurance to be billed (if applicable) for the services provided during this visit. Depending on your insurance coverage, you may receive a charge related to this service.  I need to obtain your verbal consent now. Are you willing to proceed with your visit today? Tiffany Ball has provided verbal consent on 06/22/2022 for a virtual visit (video or telephone). [name], FNP  Date: 06/22/2022 4:09 PM  Virtual Visit via Video Note   I, [name], connected with  Tiffany Ball  ([id number], [birth year]) on 06/22/22 at  4:15 PM EDT by a video-enabled telemedicine application and verified that I am speaking with the correct person using two identifiers.  Location: Patient: Virtual Visit Location Patient:  Home Provider: Virtual Visit Location Provider: Home Office   I discussed the limitations of evaluation and management by telemedicine and the availability of in person appointments. The patient expressed understanding and agreed to proceed.    History of Present Illness: Tiffany Ball is a 32 y.o. who identifies as a female who was assigned female at birth, and is being seen today for recurrent dental infection. She was treated in the ED at [facility] 05/06/22. Rx for Percocet, Augmentin and Zofran given at that time   She has been unable to get scheduled with a dentist since that time and her tooth is bothering again.   The tooth that is bothering her is top back left  Denies fevers   She is able to eat and drink on the right side  The area is sensitive to cold beverages    Problems:  Patient Active Problem List   Diagnosis Date Noted   Vitamin D deficiency 06/02/2022   Rash and nonspecific skin eruption 06/02/2022   New daily persistent headache 06/02/2022   Decreased appetite 06/02/2022   Other insomnia 06/02/2022    Allergies:  Allergies  Allergen Reactions   Plan B [Levonorgestrel] Anaphylaxis    Was eating peanuts when took this so not sure if it was plan b or peanuts.   Medications:  Current Outpatient Medications:    albuterol (VENTOLIN HFA) 108 (90 Base) MCG/ACT inhaler, Inhale 1-2 puffs into the lungs every 6 (six) hours as needed for wheezing or shortness of  breath., Disp: 18 g, Rfl: 0   melatonin 3 MG TABS tablet, Take 1 tablet (3 mg total) by mouth at bedtime., Disp: 90 tablet, Rfl: 1   SUMAtriptan (IMITREX) 50 MG tablet, Take 1 tablet (50 mg total) by mouth once as needed for migraine. May repeat in 2 hours if headache persists or recurs., Disp: 30 tablet, Rfl: 2   triamcinolone ointment (KENALOG) 0.5 %, Apply 1 Application topically 2 (two) times daily., Disp: 30 g, Rfl: 0   Vitamin D, Ergocalciferol, (DRISDOL) 1.25 MG (50000 UNIT) CAPS capsule, Take 1  capsule (50,000 Units total) by mouth every 7 (seven) days. (Patient not taking: Reported on 06/02/2022), Disp: 12 capsule, Rfl: 1  Observations/Objective: Patient is well-developed, well-nourished in no acute distress.  Resting comfortably  at home.  Head is normocephalic, atraumatic.  No labored breathing.  Speech is clear and coherent with logical content.  Patient is alert and oriented at baseline.    Assessment and Plan: 1. Dental infection Sent multiple links for locating a dentist to patient and advised the need for follow up   - amoxicillin-clavulanate (AUGMENTIN) 875-125 MG tablet; Take 1 tablet by mouth 2 (two) times daily.  Dispense: 20 tablet; Refill: 0 - naproxen (NAPROSYN) 500 MG tablet; Take 1 tablet (500 mg total) by mouth 2 (two) times daily with a meal for 10 days.  Dispense: 20 tablet; Refill: 0   * do not combine Naproxen with any other NSAID  May use Tylenol for breakthrough pain   Follow Up Instructions: I discussed the assessment and treatment plan with the patient. The patient was provided an opportunity to ask questions and all were answered. The patient agreed with the plan and demonstrated an understanding of the instructions.  A copy of instructions were sent to the patient via MyChart unless otherwise noted below.    The patient was advised to call back or seek an in-person evaluation if the symptoms worsen or if the condition fails to improve as anticipated.  Time:  I spent 15 minutes with the patient via telehealth technology discussing the above problems/concerns.    [name], FNP

## 2022-07-27 ENCOUNTER — Encounter: Payer: Self-pay | Admitting: Nurse Practitioner

## 2022-07-27 ENCOUNTER — Ambulatory Visit (INDEPENDENT_AMBULATORY_CARE_PROVIDER_SITE_OTHER): Payer: Self-pay | Admitting: Nurse Practitioner

## 2022-07-27 VITALS — BP 110/64 | HR 95 | Temp 98.3°F | Ht 62.0 in | Wt 115.0 lb

## 2022-07-27 DIAGNOSIS — Z2821 Immunization not carried out because of patient refusal: Secondary | ICD-10-CM

## 2022-07-27 DIAGNOSIS — K047 Periapical abscess without sinus: Secondary | ICD-10-CM

## 2022-07-27 DIAGNOSIS — Z6821 Body mass index (BMI) 21.0-21.9, adult: Secondary | ICD-10-CM

## 2022-07-27 DIAGNOSIS — G43009 Migraine without aura, not intractable, without status migrainosus: Secondary | ICD-10-CM

## 2022-07-27 MED ORDER — CLINDAMYCIN HCL 300 MG PO CAPS: 300.0000 mg | ORAL_CAPSULE | Freq: Three times a day (TID) | ORAL | 0 refills | Status: AC

## 2022-07-27 MED ORDER — NURTEC 75 MG PO TBDP: 1.0000 | ORAL_TABLET | Freq: Every day | ORAL | 2 refills | Status: AC

## 2022-07-27 NOTE — Patient Instructions (Addendum)
[facility] [place]  [phone]  [facility] Address: [address] [place], [place], [place] [address] Phone: [phone]

## 2022-07-27 NOTE — Progress Notes (Signed)
Jeri Cos [place],acting as a Neurosurgeon for Arnette Felts, FNP.,have documented all relevant documentation on the behalf of Arnette Felts, FNP,as directed by  Arnette Felts, FNP while in the presence of Arnette Felts, FNP.    Subjective:     Patient ID: Tiffany Ball , female    DOB: 22-Jan-1991 , 32 y.o.   MRN: [id number]   Chief Complaint  Patient presents with   Migraine    HPI  Patient presents today for a follow up on her migraines. She was started on sumatriptan during her last visit. Patient stated the medicine isnt working for her she was unable to tolerate it. She reports she had vomiting when she took sumitriptan. Patient stated she went back to taking Excedrin prn. She reports having a headache 5She also would like for you to refer her to a dentist. She has a tooth that is bothering her really bad.   Wt Readings from Last 3 Encounters: 07/27/22 : 115 lb (52.2 kg) 06/02/22 : 115 lb (52.2 kg) 05/06/22 : 109 lb (49.4 kg)   LMP - 07/25/2022  She has been treated for a tooth infection since February.    Migraine  This is a recurrent problem. The current episode started more than 1 month ago. The quality of the pain is described as aching. Associated symptoms include photophobia. Pertinent negatives include no abdominal pain, dizziness, nausea, numbness, sinus pressure, visual change or vomiting.  Headache  This is a chronic problem. Associated symptoms include photophobia. Pertinent negatives include no abdominal pain, dizziness, nausea, numbness, sinus pressure, visual change or vomiting. The symptoms are aggravated by fatigue. She has tried nothing for the symptoms.     Past Medical History:  Diagnosis Date   Hypercholesteremia    Vitamin D deficiency      History reviewed. No pertinent family history.   Current Outpatient Medications:    albuterol (VENTOLIN HFA) 108 (90 Base) MCG/ACT inhaler, Inhale 1-2 puffs into the lungs every 6 (six) hours as needed for wheezing  or shortness of breath., Disp: 18 g, Rfl: 0   aspirin-acetaminophen-caffeine (EXCEDRIN MIGRAINE) 250-250-65 MG tablet, Take 1 tablet by mouth every 6 (six) hours as needed for headache., Disp: , Rfl:    clindamycin (CLEOCIN) 300 MG capsule, Take 1 capsule (300 mg total) by mouth 3 (three) times daily for 7 days., Disp: 21 capsule, Rfl: 0   Rimegepant Sulfate (NURTEC) 75 MG TBDP, Take 1 tablet (75 mg total) by mouth daily., Disp: 16 tablet, Rfl: 2   triamcinolone ointment (KENALOG) 0.5 %, Apply 1 Application topically 2 (two) times daily., Disp: 30 g, Rfl: 0   SUMAtriptan (IMITREX) 50 MG tablet, Take 1 tablet (50 mg total) by mouth once as needed for migraine. May repeat in 2 hours if headache persists or recurs. (Patient not taking: Reported on 07/27/2022), Disp: 30 tablet, Rfl: 2   Allergies  Allergen Reactions   Plan B [Levonorgestrel] Anaphylaxis    Was eating peanuts when took this so not sure if it was plan b or peanuts.     Review of Systems  HENT:  Negative for sinus pressure.        Left facial swelling and tooth pain upper.   Eyes:  Positive for photophobia.  Respiratory: Negative.    Cardiovascular: Negative.   Gastrointestinal:  Negative for abdominal pain, nausea and vomiting.  Neurological:  Positive for headaches. Negative for dizziness and numbness.  Psychiatric/Behavioral: Negative.       Today's Vitals   07/27/22  0840  BP: 110/64  Pulse: 95  Temp: 98.3 F (36.8 C)  Weight: 115 lb (52.2 kg)  Height: 5\' 2"  (1.575 m)  PainSc: 0-No pain   Body mass index is 21.03 kg/m.   Objective:  Physical Exam Vitals reviewed.  Constitutional:      General: She is not in acute distress.    Appearance: Normal appearance.  HENT:     Mouth/Throat:     Dentition: Abnormal dentition. Dental tenderness, gingival swelling and dental abscesses (left upper molar has raised erythematous area) present.  Cardiovascular:     Rate and Rhythm: Normal rate and regular rhythm.      Pulses: Normal pulses.     Heart sounds: Normal heart sounds. No murmur heard. Pulmonary:     Effort: Pulmonary effort is normal. No respiratory distress.     Breath sounds: Normal breath sounds. No wheezing.  Neurological:     Mental Status: She is alert.         Assessment And Plan:     1. Migraine without aura and without status migrainosus, not intractable Comments: Unable to tolerate Sumitriptan. Will try her on Nurtec samples giveni in office. - Rimegepant Sulfate (NURTEC) 75 MG TBDP; Take 1 tablet (75 mg total) by mouth daily.  Dispense: 16 tablet; Refill: 2  2. Tooth abscess Comments: She has been on augmentin twice with not much improvement, will treat with clindamycin and advised to contact dentist for extraction. Discussed risks.  3. BMI 21.0-21.9, adult  4. COVID-19 vaccination declined Declines covid 19 vaccine. Discussed risk of covid 80 and if she changes her mind about the vaccine to call the office. Education has been provided regarding the importance of this vaccine but patient still declined. Advised may receive this vaccine at local pharmacy or Health Dept.or vaccine clinic. Aware to provide a copy of the vaccination record if obtained from local pharmacy or Health Dept.  Encouraged to take multivitamin, vitamin d, vitamin c and zinc to increase immune system. Aware can call office if would like to have vaccine here at office. Verbalized acceptance and understanding.    Patient was given opportunity to ask questions. Patient verbalized understanding of the plan and was able to repeat key elements of the plan. All questions were answered to their satisfaction.  Arnette Felts, FNP   I, Arnette Felts, FNP, have reviewed all documentation for this visit. The documentation on 07/27/22 for the exam, diagnosis, procedures, and orders are all accurate and complete.   IF YOU HAVE BEEN REFERRED TO A SPECIALIST, IT MAY TAKE 1-2 WEEKS TO SCHEDULE/PROCESS THE REFERRAL. IF YOU  HAVE NOT HEARD FROM US/SPECIALIST IN TWO WEEKS, PLEASE GIVE [place] A CALL AT [phone].   THE PATIENT IS ENCOURAGED TO PRACTICE SOCIAL DISTANCING DUE TO THE COVID-19 PANDEMIC.

## 2022-07-30 ENCOUNTER — Telehealth: Payer: Self-pay

## 2022-07-30 NOTE — Telephone Encounter (Signed)
PA sent to plan

## 2022-08-09 ENCOUNTER — Encounter (HOSPITAL_COMMUNITY): Payer: Self-pay | Admitting: *Deleted

## 2022-08-09 ENCOUNTER — Emergency Department (HOSPITAL_COMMUNITY)
Admission: EM | Admit: 2022-08-09 | Discharge: 2022-08-09 | Disposition: A | Discharge status: Home / Self Care | Payer: BLUE CROSS/BLUE SHIELD | Attending: Emergency Medicine | Admitting: Emergency Medicine

## 2022-08-09 ENCOUNTER — Other Ambulatory Visit: Payer: Self-pay

## 2022-08-09 ENCOUNTER — Ambulatory Visit (HOSPITAL_COMMUNITY)
Admission: EM | Admit: 2022-08-09 | Discharge: 2022-08-09 | Disposition: A | Discharge status: Other Acute Inpatient Hospital | Payer: BLUE CROSS/BLUE SHIELD

## 2022-08-09 DIAGNOSIS — K047 Periapical abscess without sinus: Secondary | ICD-10-CM | POA: Diagnosis not present

## 2022-08-09 DIAGNOSIS — R220 Localized swelling, mass and lump, head: Secondary | ICD-10-CM | POA: Diagnosis not present

## 2022-08-09 DIAGNOSIS — G43011 Migraine without aura, intractable, with status migrainosus: Secondary | ICD-10-CM | POA: Diagnosis not present

## 2022-08-09 DIAGNOSIS — K0889 Other specified disorders of teeth and supporting structures: Secondary | ICD-10-CM

## 2022-08-09 LAB — CBC WITH DIFFERENTIAL/PLATELET
Abs Immature Granulocytes: 0.01 10*3/uL (ref 0.00–0.07)
Basophils Absolute: 0 10*3/uL (ref 0.0–0.1)
Basophils Relative: 1 %
Eosinophils Absolute: 0.1 10*3/uL (ref 0.0–0.5)
Eosinophils Relative: 1 %
HCT: 38.5 % (ref 36.0–46.0)
Hemoglobin: 12.3 g/dL (ref 12.0–15.0)
Immature Granulocytes: 0 %
Lymphocytes Relative: 16 %
Lymphs Abs: 1.3 10*3/uL (ref 0.7–4.0)
MCH: 28.5 pg (ref 26.0–34.0)
MCHC: 31.9 g/dL (ref 30.0–36.0)
MCV: 89.1 fL (ref 80.0–100.0)
Monocytes Absolute: 1.1 10*3/uL — ABNORMAL HIGH (ref 0.1–1.0)
Monocytes Relative: 13 %
Neutro Abs: 5.7 10*3/uL (ref 1.7–7.7)
Neutrophils Relative %: 69 %
Platelets: 292 10*3/uL (ref 150–400)
RBC: 4.32 MIL/uL (ref 3.87–5.11)
RDW: 12 % (ref 11.5–15.5)
WBC: 8.1 10*3/uL (ref 4.0–10.5)
nRBC: 0 % (ref 0.0–0.2)

## 2022-08-09 LAB — COMPREHENSIVE METABOLIC PANEL
ALT: 9 U/L (ref 0–44)
AST: 17 U/L (ref 15–41)
Albumin: 4.7 g/dL (ref 3.5–5.0)
Alkaline Phosphatase: 49 U/L (ref 38–126)
Anion gap: 8 (ref 5–15)
BUN: 7 mg/dL (ref 6–20)
CO2: 27 mmol/L (ref 22–32)
Calcium: 9.4 mg/dL (ref 8.9–10.3)
Chloride: 102 mmol/L (ref 98–111)
Creatinine, Ser: 0.62 mg/dL (ref 0.44–1.00)
GFR, Estimated: >60 mL/min (ref >60)
Glucose, Bld: 110 mg/dL — ABNORMAL HIGH (ref 70–99)
Potassium: 3.8 mmol/L (ref 3.5–5.1)
Sodium: 137 mmol/L (ref 135–145)
Total Bilirubin: 1.3 mg/dL — ABNORMAL HIGH (ref 0.3–1.2)
Total Protein: 8.5 g/dL — ABNORMAL HIGH (ref 6.5–8.1)

## 2022-08-09 MED ORDER — LACTATED RINGERS IV BOLUS
1000.0000 mL | Freq: Once | INTRAVENOUS | Status: AC
  Administered 2022-08-09: 1000 mL via INTRAVENOUS

## 2022-08-09 MED ORDER — SODIUM CHLORIDE 0.9 % IV SOLN
3.0000 g | Freq: Once | INTRAVENOUS | Status: AC
  Administered 2022-08-09: 3 g via INTRAVENOUS
  Filled 2022-08-09: qty 8

## 2022-08-09 MED ORDER — PENICILLIN V POTASSIUM 500 MG PO TABS: 500.0000 mg | ORAL_TABLET | Freq: Four times a day (QID) | ORAL | 0 refills | Status: AC

## 2022-08-09 MED ORDER — IBUPROFEN 800 MG PO TABS: 800.0000 mg | ORAL_TABLET | Freq: Three times a day (TID) | ORAL | 0 refills | Status: DC

## 2022-08-09 NOTE — ED Triage Notes (Addendum)
Pt reports dental pain on Lt since FEBRUARY 2024. Pt has clindamycin 300mg   given by PCP for dental problem on 07/27/22. Pt has not been taking med as ordered.

## 2022-08-09 NOTE — ED Triage Notes (Signed)
Pt states has a tooth infection and started feeling "throat closing up" since yesterday states swelling is better today. [facility].

## 2022-08-09 NOTE — ED Provider Notes (Signed)
  [place] EMERGENCY DEPARTMENT AT [facility] Provider Note   CSN: [id number] Arrival date & time: 08/09/22  1528     History {Add pertinent medical, surgical, social history, OB history to HPI:1} Chief Complaint  Patient presents with   Oral Swelling    Tiffany Ball is a 32 y.o. female.  Patient complains of a toothache for the last couple weeks.  She was seen at the urgent care today and they were concerned about swelling in her throat   Dental Pain      Home Medications Prior to Admission medications   Medication Sig Start Date End Date Taking? Authorizing Provider  albuterol (VENTOLIN HFA) 108 (90 Base) MCG/ACT inhaler Inhale 1-2 puffs into the lungs every 6 (six) hours as needed for wheezing or shortness of breath. 12/16/20   Arnette Felts, FNP  aspirin-acetaminophen-caffeine (EXCEDRIN MIGRAINE) 3172952514 MG tablet Take 1 tablet by mouth every 6 (six) hours as needed for headache.    [provider]  Rimegepant Sulfate (NURTEC) 75 MG TBDP Take 1 tablet (75 mg total) by mouth daily. 07/27/22   Arnette Felts, FNP  triamcinolone ointment (KENALOG) 0.5 % Apply 1 Application topically 2 (two) times daily. 06/02/22   Arnette Felts, FNP      Allergies    Imitrex [sumatriptan] and Plan b [levonorgestrel]    Review of Systems   Review of Systems  Physical Exam Updated Vital Signs BP 115/87 (BP Location: Left Arm)   Pulse 90   Temp 98.8 F (37.1 C) (Oral)   Resp 16   Ht 5\' 2"  (1.575 m)   Wt 52 kg   LMP 07/26/2022   SpO2 100%   BMI 20.97 kg/m  Physical Exam  ED Results / Procedures / Treatments   Labs (all labs ordered are listed, but only abnormal results are displayed) Labs Reviewed  CBC WITH DIFFERENTIAL/PLATELET - Abnormal; Notable for the following components:      Result Value   Monocytes Absolute 1.1 (*)    All other components within normal limits  COMPREHENSIVE METABOLIC PANEL    EKG None  Radiology No results  found.  Procedures Procedures  {Document cardiac monitor, telemetry assessment procedure when appropriate:1}  Medications Ordered in ED Medications  Ampicillin-Sulbactam (UNASYN) 3 g in sodium chloride 0.9 % 100 mL IVPB (3 g Intravenous New Bag/Given 08/09/22 1617)  lactated ringers bolus 1,000 mL (1,000 mLs Intravenous New Bag/Given 08/09/22 1617)    ED Course/ Medical Decision Making/ A&P   {   Click here for ABCD2, HEART and other calculatorsREFRESH Note before signing :1}                          Medical Decision Making Risk Prescription drug management.   Patient has an abscessed tooth.  She is given penicillin and Motrin and will see the dentist this week  {Document critical care time when appropriate:1} {Document review of labs and clinical decision tools ie heart score, Chads2Vasc2 etc:1}  {Document your independent review of radiology images, and any outside records:1} {Document your discussion with family members, caretakers, and with consultants:1} {Document social determinants of health affecting pt's care:1} {Document your decision making why or why not admission, treatments were needed:1} Final Clinical Impression(s) / ED Diagnoses Final diagnoses:  None    Rx / DC Orders ED Discharge Orders     None

## 2022-08-09 NOTE — Discharge Instructions (Signed)
Since you are having more left sided facial swelling, with a feeling of your throat closing and difficulty breathing and you have used your Albuterol inhaler with minimal relief along with a persistent migraine headache that is getting worse and not responding to your usual medication, recommend you go to the ER NOW for further evaluation and treatment.

## 2022-08-09 NOTE — ED Provider Notes (Signed)
[facility]    CSN: [id number] Arrival date & time: 08/09/22  1317      History   Chief Complaint Chief Complaint  Patient presents with   Dental Pain    HPI Tiffany Ball is a 32 y.o. female.   32 year old female presents with history of recurrent left upper dental pain and infections over the past 3 months. Started with probable dental abscess and went to the ER on Feb 8th 2024. Was placed on Augmentin and Percocet and told to follow-up with dentist. She has been unable to get dental insurance and has not been able to get an appointment with a dentist. Then patient continued to have left sided dental pain and had a video visit with [facility] and was prescribed Augmentin again. She then went to her PCP on 07/27/22 with continued left upper dental pain and was placed on Clindamycin for 7 days and was referred again to a dentist. She has been having difficulty taking the Clindamycin and it is not helping. Now she is having more left sided facial swelling and today feels that her throat is closing and she is feeling that she can't breathe well. She has used her Albuterol inhaler a few times today (it did expired in March 2024) with minimal relief. Also she has a history of migraine headaches and has had a severe headache for the past few days. She has tried her usual medication (Excedrin migraine) with no relief. She is unable to take Imitrex due to a reaction and her insurance will not cover Nurtec that her PCP recently prescribed. The dental pain is making the migraine worse and she is nauseous. She is concerned the dental infection is spreading into her facial bones/bloodstream. She denies any distinct fever and any URI symptoms. She did have a call from a dentist office while waiting here today at Urgent Care and they are calling her back with possible dental appointment tomorrow. No other current chronic health issues besides those mentioned above. No other daily medication.    The history is provided by the patient.    Past Medical History:  Diagnosis Date   Hypercholesteremia    Vitamin D deficiency     Patient Active Problem List   Diagnosis Date Noted   Tooth abscess 07/27/2022   Vitamin D deficiency 06/02/2022   Rash and nonspecific skin eruption 06/02/2022   New daily persistent headache 06/02/2022   Decreased appetite 06/02/2022   Other insomnia 06/02/2022    Past Surgical History:  Procedure Laterality Date   knee cyst  2008   KNEE SURGERY     right   WISDOM TOOTH EXTRACTION      OB History     Gravida  1   Para  0   Term  0   Preterm  0   AB  0   Living         SAB  0   IAB  0   Ectopic  0   Multiple      Live Births               Home Medications    Prior to Admission medications   Medication Sig Start Date End Date Taking? Authorizing Provider  albuterol (VENTOLIN HFA) 108 (90 Base) MCG/ACT inhaler Inhale 1-2 puffs into the lungs every 6 (six) hours as needed for wheezing or shortness of breath. 12/16/20   Arnette Felts, FNP  aspirin-acetaminophen-caffeine (EXCEDRIN MIGRAINE) [phone] MG tablet Take  1 tablet by mouth every 6 (six) hours as needed for headache.    [provider]  Rimegepant Sulfate (NURTEC) 75 MG TBDP Take 1 tablet (75 mg total) by mouth daily. 07/27/22   Arnette Felts, FNP  triamcinolone ointment (KENALOG) 0.5 % Apply 1 Application topically 2 (two) times daily. 06/02/22   Arnette Felts, FNP    Family History History reviewed. No pertinent family history.  Social History Social History   Tobacco Use   Smoking status: Never   Smokeless tobacco: Never  Vaping Use   Vaping Use: Never used  Substance Use Topics   Alcohol use: Yes    Comment: social   Drug use: No     Allergies   Imitrex [sumatriptan] and Plan b [levonorgestrel]   Review of Systems Review of Systems  Constitutional:  Positive for appetite change, chills and fatigue. Negative for diaphoresis and  fever.  HENT:  Positive for dental problem, facial swelling (left side), sore throat (irritated) and trouble swallowing. Negative for congestion, ear discharge, ear pain, mouth sores, postnasal drip, rhinorrhea, sinus pressure, sinus pain and voice change.   Eyes:  Negative for pain, discharge, redness, itching and visual disturbance.  Respiratory:  Positive for chest tightness. Negative for cough, shortness of breath and wheezing.   Gastrointestinal:  Positive for nausea. Negative for diarrhea.  Musculoskeletal:  Positive for myalgias. Negative for neck pain and neck stiffness.  Skin:  Negative for color change and rash.  Allergic/Immunologic: Negative for environmental allergies, food allergies and immunocompromised state.  Neurological:  Positive for light-headedness and headaches. Negative for tremors, seizures, syncope, speech difficulty and numbness.  Hematological:  Negative for adenopathy. Does not bruise/bleed easily.  Psychiatric/Behavioral:  Positive for sleep disturbance.      Physical Exam Triage Vital Signs ED Triage Vitals  Enc Vitals Group     BP 08/09/22 1417 115/79     Pulse Rate 08/09/22 1417 (!) 115     Resp 08/09/22 1417 18     Temp 08/09/22 1417 98.8 F (37.1 C)     Temp src --      SpO2 08/09/22 1417 97 %     Weight --      Height --      Head Circumference --      Peak Flow --      Pain Score 08/09/22 1413 10     Pain Loc --      Pain Edu? --      Excl. in GC? --    No data found.  Updated Vital Signs BP 115/79   Pulse (!) 115   Temp 98.8 F (37.1 C)   Resp 18   LMP 07/26/2022   SpO2 97%   Visual Acuity Right Eye Distance:   Left Eye Distance:   Bilateral Distance:    Right Eye Near:   Left Eye Near:    Bilateral Near:     Physical Exam Vitals and nursing note reviewed.  Constitutional:      General: She is awake. She is not in acute distress.    Appearance: She is well-developed. She is ill-appearing.     Comments: She is sitting on  the exam table in no acute distress but appears uncomfortable due to pain and appears ill.   HENT:     Head: Normocephalic.     Jaw: There is normal jaw occlusion.      Comments: Left lower maxillary swelling present and tenderness. No distinct redness or rash.  Right Ear: Hearing, tympanic membrane, ear canal and external ear normal.     Left Ear: Hearing, tympanic membrane, ear canal and external ear normal.     Nose: Nose normal.     Mouth/Throat:     Lips: Pink.     Mouth: Mucous membranes are moist.     Dentition: Dental tenderness and gingival swelling present.     Pharynx: Oropharynx is clear. Uvula midline. No pharyngeal swelling, oropharyngeal exudate, posterior oropharyngeal erythema or uvula swelling.      Comments: Swelling and erythema present along left upper 3rd/4th molar on lateral aspect above gumline. Very tender. No discharge expressed. No tenderness of lower left side or on right side.  Eyes:     Extraocular Movements: Extraocular movements intact.     Conjunctiva/sclera: Conjunctivae normal.  Cardiovascular:     Rate and Rhythm: Regular rhythm. Tachycardia present.     Heart sounds: Normal heart sounds. No murmur heard. Pulmonary:     Effort: Pulmonary effort is normal. No tachypnea, respiratory distress or retractions.     Breath sounds: Normal breath sounds and air entry. No decreased air movement. No decreased breath sounds, wheezing, rhonchi or rales.  Musculoskeletal:     Cervical back: Normal range of motion and neck supple.  Lymphadenopathy:     Cervical: No cervical adenopathy.  Skin:    General: Skin is warm and dry.     Capillary Refill: Capillary refill takes less than 2 seconds.     Findings: No erythema or rash.  Neurological:     General: No focal deficit present.     Mental Status: She is alert and oriented to person, place, and time.     Sensory: Sensation is intact. No sensory deficit.  Psychiatric:        Attention and Perception:  Attention normal.        Mood and Affect: Mood normal.        Speech: Speech normal.        Behavior: Behavior is cooperative.        Thought Content: Thought content normal.      UC Treatments / Results  Labs (all labs ordered are listed, but only abnormal results are displayed) Labs Reviewed - No data to display  EKG   Radiology No results found.  Procedures Procedures (including critical care time)  Medications Ordered in UC Medications - No data to display  Initial Impression / Assessment and Plan / UC Course  I have reviewed the triage vital signs and the nursing notes.  Pertinent labs & imaging results that were available during my care of the patient were reviewed by me and considered in my medical decision making (see chart for details).     Reviewed with patient concern over prolonged dental infection and possible facial bone involvement, especially with recent left facial swelling and increased tenderness and pain. No current fever. Migraine is not controlled, with worsening pain and nausea and may need additional management that is not available here at Urgent Care. Also due to feeling of throat closing and concern over breathing, recommend patient go to the ER now for further evaluation. Patient is stable, in pain and slightly nervous, but vitals are normal except for mild tachycardia. She will go to [facility] ER now by personal vehicle. Patient understands and agrees with plan.  Final Clinical Impressions(s) / UC Diagnoses   Final diagnoses:  Left facial swelling  Dental infection  Pain, dental  Intractable migraine without aura and  with status migrainosus     Discharge Instructions      Since you are having more left sided facial swelling, with a feeling of your throat closing and difficulty breathing and you have used your Albuterol inhaler with minimal relief along with a persistent migraine headache that is getting worse and not responding to your  usual medication, recommend you go to the ER NOW for further evaluation and treatment.     ED Prescriptions   None    PDMP not reviewed this encounter.   Sudie Grumbling, NP 08/09/22 1739

## 2022-08-09 NOTE — Discharge Instructions (Signed)
Follow up with the dentist tomorrow as planned

## 2022-08-10 ENCOUNTER — Telehealth: Payer: Self-pay

## 2022-08-10 NOTE — Transitions of Care (Post Inpatient/ED Visit) (Signed)
   08/10/2022  Name: Tiffany Ball MRN: [id number] DOB: 1991/03/09  Today's TOC FU Call Status: Today's TOC FU Call Status:: Successful TOC FU Call Competed TOC FU Call Complete Date: 08/10/22  Transition Care Management Follow-up Telephone Call Date of Discharge: 08/09/22 Discharge Facility: [facility]) Type of Discharge: Emergency Department Reason for ED Visit: Other: How have you been since you were released from the hospital?: Better Any questions or concerns?: No  Items Reviewed: Did you receive and understand the discharge instructions provided?: Yes Medications obtained,verified, and reconciled?: Yes (Medications Reviewed) Any new allergies since your discharge?: No Dietary orders reviewed?: No Do you have support at home?: Yes  Medications Reviewed Today: Medications Reviewed Today     Reviewed by Maryan Puls, RN (Registered Nurse) on 08/09/22 at 1415  Med List Status: <None>   Medication Order Taking? Sig Documenting Provider Last Dose Status Informant  albuterol (VENTOLIN HFA) 108 (90 Base) MCG/ACT inhaler [id number]  Inhale 1-2 puffs into the lungs every 6 (six) hours as needed for wheezing or shortness of breath. Arnette Felts, FNP  Active   aspirin-acetaminophen-caffeine [facility] MIGRAINE) [phone] MG tablet [phone]  Take 1 tablet by mouth every 6 (six) hours as needed for headache. [provider]  Active   Rimegepant Sulfate (NURTEC) 75 MG TBDP [id number]  Take 1 tablet (75 mg total) by mouth daily. Arnette Felts, FNP  Active   SUMAtriptan (IMITREX) 50 MG tablet [id number]  Take 1 tablet (50 mg total) by mouth once as needed for migraine. May repeat in 2 hours if headache persists or recurs.  Patient not taking: Reported on 07/27/2022   Arnette Felts, FNP  Active   triamcinolone ointment (KENALOG) 0.5 % [id number]  Apply 1 Application topically 2 (two) times daily. Arnette Felts, FNP  Active             Home Care and  Equipment/Supplies: Were Home Health Services Ordered?: NA Any new equipment or medical supplies ordered?: NA  Functional Questionnaire: Do you need assistance with bathing/showering or dressing?: No Do you need assistance with meal preparation?: No Do you need assistance with eating?: No Do you have difficulty maintaining continence: No Do you need assistance with getting out of bed/getting out of a chair/moving?: No Do you have difficulty managing or taking your medications?: No  Follow up appointments reviewed: PCP Follow-up appointment confirmed?: No MD Provider Line [phone] Given: Yes Specialist Hospital Follow-up appointment confirmed?: NA Do you need transportation to your follow-up appointment?: No Do you understand care options if your condition(s) worsen?: Yes-patient verbalized understanding    SIGNATURE:YL,RMA

## 2022-08-18 NOTE — Telephone Encounter (Signed)
Denied on May 6

## 2022-09-09 ENCOUNTER — Encounter: Payer: Self-pay | Admitting: Nurse Practitioner

## 2022-09-09 ENCOUNTER — Other Ambulatory Visit: Payer: Self-pay | Admitting: Nurse Practitioner

## 2022-09-09 DIAGNOSIS — L2489 Irritant contact dermatitis due to other agents: Secondary | ICD-10-CM

## 2022-09-09 DIAGNOSIS — G43009 Migraine without aura, not intractable, without status migrainosus: Secondary | ICD-10-CM

## 2022-09-09 MED ORDER — TRIAMCINOLONE ACETONIDE 0.5 % EX OINT
1.0000 | TOPICAL_OINTMENT | Freq: Two times a day (BID) | CUTANEOUS | 0 refills | Status: AC
Start: 2022-09-09 — End: ?

## 2022-09-09 MED ORDER — UBRELVY 50 MG PO TABS
50.0000 mg | ORAL_TABLET | ORAL | 1 refills | Status: AC | PRN
Start: 2022-09-09 — End: ?

## 2022-09-09 MED ORDER — ALBUTEROL SULFATE HFA 108 (90 BASE) MCG/ACT IN AERS: 1.0000 | INHALATION_SPRAY | Freq: Four times a day (QID) | RESPIRATORY_TRACT | 0 refills | Status: AC | PRN

## 2022-09-14 ENCOUNTER — Ambulatory Visit: Payer: Self-pay | Admitting: Nurse Practitioner

## 2022-10-14 ENCOUNTER — Ambulatory Visit: Payer: Self-pay | Admitting: Nurse Practitioner

## 2023-09-23 ENCOUNTER — Other Ambulatory Visit: Payer: Self-pay

## 2023-09-23 ENCOUNTER — Encounter (HOSPITAL_COMMUNITY): Payer: Self-pay

## 2023-09-23 ENCOUNTER — Emergency Department (HOSPITAL_COMMUNITY)
Admission: EM | Admit: 2023-09-23 | Discharge: 2023-09-24 | Disposition: A | Discharge status: Home / Self Care | Attending: Emergency Medicine | Admitting: Emergency Medicine

## 2023-09-23 DIAGNOSIS — R0789 Other chest pain: Secondary | ICD-10-CM | POA: Diagnosis not present

## 2023-09-23 DIAGNOSIS — R079 Chest pain, unspecified: Secondary | ICD-10-CM | POA: Diagnosis present

## 2023-09-23 DIAGNOSIS — Z7982 Long term (current) use of aspirin: Secondary | ICD-10-CM | POA: Diagnosis not present

## 2023-09-23 LAB — CBC
HCT: 36.2 % (ref 36.0–46.0)
Hemoglobin: 11.5 g/dL — ABNORMAL LOW (ref 12.0–15.0)
MCH: 28.4 pg (ref 26.0–34.0)
MCHC: 31.8 g/dL (ref 30.0–36.0)
MCV: 89.4 fL (ref 80.0–100.0)
Platelets: 344 10*3/uL (ref 150–400)
RBC: 4.05 MIL/uL (ref 3.87–5.11)
RDW: 12 % (ref 11.5–15.5)
WBC: 6.2 10*3/uL (ref 4.0–10.5)
nRBC: 0 % (ref 0.0–0.2)

## 2023-09-23 LAB — BASIC METABOLIC PANEL WITH GFR
Anion gap: 11 (ref 5–15)
BUN: 9 mg/dL (ref 6–20)
CO2: 25 mmol/L (ref 22–32)
Calcium: 9.3 mg/dL (ref 8.9–10.3)
Chloride: 102 mmol/L (ref 98–111)
Creatinine, Ser: 0.47 mg/dL (ref 0.44–1.00)
GFR, Estimated: >60 mL/min (ref >60)
Glucose, Bld: 101 mg/dL — ABNORMAL HIGH (ref 70–99)
Potassium: 3.2 mmol/L — ABNORMAL LOW (ref 3.5–5.1)
Sodium: 138 mmol/L (ref 135–145)

## 2023-09-23 LAB — TROPONIN I (HIGH SENSITIVITY): Troponin I (High Sensitivity): <2 ng/L (ref <18)

## 2023-09-23 LAB — HCG, SERUM, QUALITATIVE: Preg, Serum: NEGATIVE

## 2023-09-23 NOTE — ED Triage Notes (Signed)
 Pt came in for chest pain for two days. Pt stated she was talking today and stopped talking randomly.

## 2023-09-24 ENCOUNTER — Emergency Department (HOSPITAL_COMMUNITY)

## 2023-09-24 LAB — TROPONIN I (HIGH SENSITIVITY): Troponin I (High Sensitivity): <2 ng/L (ref <18)

## 2023-09-24 MED ORDER — IBUPROFEN 800 MG PO TABS: 800.0000 mg | ORAL_TABLET | Freq: Three times a day (TID) | ORAL | 0 refills | Status: AC

## 2023-09-24 MED ORDER — KETOROLAC TROMETHAMINE 30 MG/ML IJ SOLN
30.0000 mg | Freq: Once | INTRAMUSCULAR | Status: AC
  Administered 2023-09-24: 30 mg via INTRAVENOUS
  Filled 2023-09-24: qty 1

## 2023-09-24 NOTE — ED Provider Notes (Signed)
 [place] EMERGENCY DEPARTMENT AT [facility] Provider Note   CSN: [id number] Arrival date & time: 09/23/23  2026     Patient presents with: Chest Pain   Tiffany Ball is a 33 y.o. female.   HPI     This is a 33 year old female who presents with chest pain.  Reports that she has had chest pain over the last 24 hours.  States that it comes and goes although it became worse while she was at work earlier this evening.  Is on the right side of her chest.  It is point tender.  It is worse with certain movements and radiates to her back when she moves a certain way.  She does do some heavy lifting at work.  No recent fevers or cough.  She took an aspirin but no other medications.  Does not use any estrogen products of birth control.  Denies history of blood clot or lower extremity swelling.  Prior to Admission medications   Medication Sig Start Date End Date Taking? Authorizing Provider  albuterol  (VENTOLIN  HFA) 108 (90 Base) MCG/ACT inhaler Inhale 1-2 puffs into the lungs every 6 (six) hours as needed for wheezing or shortness of breath. 09/09/22   Georgina Speaks, FNP  aspirin-acetaminophen -caffeine (EXCEDRIN MIGRAINE) 250-250-65 MG tablet Take 1 tablet by mouth every 6 (six) hours as needed for headache.    [provider]  ibuprofen  (ADVIL ) 800 MG tablet Take 1 tablet (800 mg total) by mouth 3 (three) times daily. 09/24/23   Beren Yniguez, Charmaine FALCON, MD  Rimegepant Sulfate (NURTEC) 75 MG TBDP Take 1 tablet (75 mg total) by mouth daily. 07/27/22   Georgina Speaks, FNP  triamcinolone  ointment (KENALOG ) 0.5 % Apply 1 Application topically 2 (two) times daily. 09/09/22   Georgina Speaks, FNP  Ubrogepant  (UBRELVY ) 50 MG TABS Take 1 tablet (50 mg total) by mouth as needed. 09/09/22   Georgina Speaks, FNP    Allergies: Imitrex  [sumatriptan ] and Plan b [levonorgestrel]    Review of Systems  Constitutional:  Negative for fever.  Respiratory:  Negative for cough and shortness of breath.    Cardiovascular:  Positive for chest pain.  All other systems reviewed and are negative.   Updated Vital Signs BP (!) 144/99 (BP Location: Left Arm)   Pulse 80   Temp 98.8 F (37.1 C) (Oral)   Resp 18   Ht 1.549 m (5' 1)   Wt 58.1 kg   LMP 09/20/2023 (Approximate)   SpO2 100%   BMI 24.19 kg/m   Physical Exam Vitals and nursing note reviewed.  Constitutional:      Appearance: She is well-developed.  HENT:     Head: Normocephalic and atraumatic.   Eyes:     Pupils: Pupils are equal, round, and reactive to light.    Cardiovascular:     Rate and Rhythm: Normal rate and regular rhythm.     Heart sounds: Normal heart sounds.  Pulmonary:     Effort: Pulmonary effort is normal. No respiratory distress.     Breath sounds: No wheezing.  Chest:     Chest wall: Tenderness present. No crepitus.  Abdominal:     General: Bowel sounds are normal.     Palpations: Abdomen is soft.   Musculoskeletal:     Cervical back: Neck supple.   Skin:    General: Skin is warm and dry.   Neurological:     Mental Status: She is alert and oriented to person, place, and time.  Psychiatric:        Mood and Affect: Mood normal.     (all labs ordered are listed, but only abnormal results are displayed) Labs Reviewed  BASIC METABOLIC PANEL WITH GFR - Abnormal; Notable for the following components:      Result Value   Potassium 3.2 (*)    Glucose, Bld 101 (*)    All other components within normal limits  CBC - Abnormal; Notable for the following components:   Hemoglobin 11.5 (*)    All other components within normal limits  HCG, SERUM, QUALITATIVE  TROPONIN I (HIGH SENSITIVITY)  TROPONIN I (HIGH SENSITIVITY)    EKG: EKG Interpretation Date/Time:  Friday September 23 2023 20:39:05 EDT Ventricular Rate:  88 PR Interval:  134 QRS Duration:  66 QT Interval:  352 QTC Calculation: 426 R Axis:   72  Text Interpretation: Sinus rhythm Confirmed by Bari Pfeiffer [phone]) on 09/24/2023  1:05:49 AM  Radiology: [facility] Chest Port 1 View Result Date: 09/24/2023 CLINICAL DATA:  Upper right chest pain. EXAM: PORTABLE CHEST 1 VIEW COMPARISON:  February 13, 2021 FINDINGS: The heart size and mediastinal contours are within normal limits. Both lungs are clear. The visualized skeletal structures are unremarkable. IMPRESSION: No active disease. Electronically Signed   By: Suzen Dials M.D.   On: 09/24/2023 00:50     Procedures   Medications Ordered in the ED  ketorolac (TORADOL) 30 MG/ML injection 30 mg (30 mg Intravenous Given 09/24/23 0116)                                    Medical Decision Making Amount and/or Complexity of Data Reviewed Labs: ordered. Radiology: ordered.  Risk Prescription drug management.   This patient presents to the ED for concern of chest pain, this involves an extensive number of treatment options, and is a complaint that carries with it a high risk of complications and morbidity.  I considered the following differential and admission for this acute, potentially life threatening condition.  The differential diagnosis includes ACS, PE, pneumothorax, pneumonia, chest wall pain  MDM:    This is a 33 year old female who presents with chest pain.  She is nontoxic and vital signs are notable for blood pressure 144/99.  She has reproducible tenderness on exam.  EKG shows no evidence of acute ischemia or arrhythmia.  Troponin x 2 negative.  She is PE RC negative.  Basic lab work is otherwise reassuring.  Chest x-ray without pneumothorax or pneumonia.  Given reproducible nature pain and pain with certain movements and lifting, suspect musculoskeletal etiology.  Will treat with anti-inflammatories.  (Labs, imaging, consults)  Labs: I Ordered, and personally interpreted labs.  The pertinent results include: CBC, BMP, troponin x 2  Imaging Studies ordered: I ordered imaging studies including chest x-ray I independently visualized and interpreted  imaging. I agree with the radiologist interpretation  Additional history obtained from chart review.  External records from outside source obtained and reviewed including prior evaluations  Cardiac Monitoring: The patient was maintained on a cardiac monitor.  If on the cardiac monitor, I personally viewed and interpreted the cardiac monitored which showed an underlying rhythm of: Sinus  Reevaluation: After the interventions noted above, I reevaluated the patient and found that they have :improved  Social Determinants of Health:  lives independently  Disposition: Discharge  Co morbidities that complicate the patient evaluation  Past Medical History:  Diagnosis Date  Hypercholesteremia    Vitamin D  deficiency      Medicines Meds ordered this encounter  Medications   ketorolac (TORADOL) 30 MG/ML injection 30 mg   ibuprofen  (ADVIL ) 800 MG tablet    Sig: Take 1 tablet (800 mg total) by mouth 3 (three) times daily.    Dispense:  15 tablet    Refill:  0    I have reviewed the patients home medicines and have made adjustments as needed  Problem List / ED Course: Problem List Items Addressed This Visit   None Visit Diagnoses       Chest wall pain    -  Primary                Final diagnoses:  Chest wall pain    ED Discharge Orders          Ordered    ibuprofen  (ADVIL ) 800 MG tablet  3 times daily        09/24/23 0136               Mariaelena Cade, Charmaine FALCON, MD 09/24/23 [phone]

## 2023-09-24 NOTE — Discharge Instructions (Addendum)
 You were seen today for chest pain.  Your workup was reassuring.  This may be musculoskeletal in nature.  Take ibuprofen  for your pain.
